# Patient Record
Sex: Male | Born: 1965 | Race: Black or African American | Hispanic: No | Marital: Single | Smoking: Current every day smoker
Health system: Southern US, Community
[De-identification: ages and names within clinical notes are randomized; demographics above are authoritative.]

## PROBLEM LIST (undated history)

## (undated) DIAGNOSIS — W3400XA Accidental discharge from unspecified firearms or gun, initial encounter: Secondary | ICD-10-CM

## (undated) DIAGNOSIS — E785 Hyperlipidemia, unspecified: Secondary | ICD-10-CM

## (undated) DIAGNOSIS — I10 Essential (primary) hypertension: Secondary | ICD-10-CM

## (undated) HISTORY — DX: Accidental discharge from unspecified firearms or gun, initial encounter: W34.00XA

---

## 2015-12-23 ENCOUNTER — Emergency Department (HOSPITAL_COMMUNITY): Payer: Self-pay

## 2015-12-23 ENCOUNTER — Emergency Department (HOSPITAL_COMMUNITY)
Admission: EM | Admit: 2015-12-23 | Discharge: 2015-12-24 | Disposition: A | Discharge status: Home / Self Care | Payer: Self-pay | Attending: Emergency Medicine | Admitting: Emergency Medicine

## 2015-12-23 ENCOUNTER — Encounter (HOSPITAL_COMMUNITY): Payer: Self-pay | Admitting: *Deleted

## 2015-12-23 DIAGNOSIS — R030 Elevated blood-pressure reading, without diagnosis of hypertension: Secondary | ICD-10-CM

## 2015-12-23 DIAGNOSIS — IMO0001 Reserved for inherently not codable concepts without codable children: Secondary | ICD-10-CM

## 2015-12-23 DIAGNOSIS — F17210 Nicotine dependence, cigarettes, uncomplicated: Secondary | ICD-10-CM | POA: Insufficient documentation

## 2015-12-23 DIAGNOSIS — I10 Essential (primary) hypertension: Secondary | ICD-10-CM | POA: Insufficient documentation

## 2015-12-23 DIAGNOSIS — H538 Other visual disturbances: Secondary | ICD-10-CM | POA: Insufficient documentation

## 2015-12-23 DIAGNOSIS — K0889 Other specified disorders of teeth and supporting structures: Secondary | ICD-10-CM | POA: Insufficient documentation

## 2015-12-23 HISTORY — DX: Essential (primary) hypertension: I10

## 2015-12-23 LAB — CBC WITH DIFFERENTIAL/PLATELET
BASOS ABS: 0 10*3/uL (ref 0.0–0.1)
Basophils Relative: 0 %
EOS PCT: 2 %
Eosinophils Absolute: 0.1 10*3/uL (ref 0.0–0.7)
HEMATOCRIT: 40.4 % (ref 39.0–52.0)
Hemoglobin: 13 g/dL (ref 13.0–17.0)
LYMPHS PCT: 47 %
Lymphs Abs: 2.3 10*3/uL (ref 0.7–4.0)
MCH: 29.5 pg (ref 26.0–34.0)
MCHC: 32.2 g/dL (ref 30.0–36.0)
MCV: 91.8 fL (ref 78.0–100.0)
MONO ABS: 0.4 10*3/uL (ref 0.1–1.0)
MONOS PCT: 8 %
NEUTROS ABS: 2.1 10*3/uL (ref 1.7–7.7)
Neutrophils Relative %: 43 %
PLATELETS: 173 10*3/uL (ref 150–400)
RBC: 4.4 MIL/uL (ref 4.22–5.81)
RDW: 13.2 % (ref 11.5–15.5)
WBC: 4.9 10*3/uL (ref 4.0–10.5)

## 2015-12-23 MED ORDER — HYDROCODONE-ACETAMINOPHEN 5-325 MG PO TABS
1.0000 | ORAL_TABLET | Freq: Once | ORAL | Status: AC
  Administered 2015-12-24: 1 via ORAL
  Filled 2015-12-23: qty 1

## 2015-12-23 MED ORDER — HYDROCODONE-ACETAMINOPHEN 5-325 MG PO TABS
1.0000 | ORAL_TABLET | Freq: Once | ORAL | Status: AC
  Administered 2015-12-23: 1 via ORAL
  Filled 2015-12-23: qty 1

## 2015-12-23 NOTE — ED Notes (Signed)
Pt arrives via EMS from home. Pt has not taken BP meds in 5 years. Pt called EMS because of high blood pressure and headache, also c/o mouth pain from broken tooth. Pt also states that his left arm has been going numb intermittently x1 month.

## 2015-12-24 ENCOUNTER — Ambulatory Visit: Payer: Self-pay | Attending: Family Medicine | Admitting: Family Medicine

## 2015-12-24 ENCOUNTER — Encounter: Payer: Self-pay | Admitting: Family Medicine

## 2015-12-24 VITALS — BP 148/97 | HR 66 | Temp 97.7°F | Resp 16 | Ht 79.0 in | Wt 232.2 lb

## 2015-12-24 DIAGNOSIS — S025XXA Fracture of tooth (traumatic), initial encounter for closed fracture: Secondary | ICD-10-CM | POA: Insufficient documentation

## 2015-12-24 DIAGNOSIS — K0889 Other specified disorders of teeth and supporting structures: Secondary | ICD-10-CM

## 2015-12-24 DIAGNOSIS — F17200 Nicotine dependence, unspecified, uncomplicated: Secondary | ICD-10-CM

## 2015-12-24 DIAGNOSIS — Z720 Tobacco use: Secondary | ICD-10-CM | POA: Insufficient documentation

## 2015-12-24 DIAGNOSIS — Z79899 Other long term (current) drug therapy: Secondary | ICD-10-CM | POA: Insufficient documentation

## 2015-12-24 DIAGNOSIS — I10 Essential (primary) hypertension: Secondary | ICD-10-CM

## 2015-12-24 LAB — URINE MICROSCOPIC-ADD ON: Squamous Epithelial / LPF: NONE SEEN

## 2015-12-24 LAB — BASIC METABOLIC PANEL
Anion gap: 10 (ref 5–15)
BUN: 7 mg/dL (ref 6–20)
CALCIUM: 8.7 mg/dL — AB (ref 8.9–10.3)
CHLORIDE: 104 mmol/L (ref 101–111)
CO2: 24 mmol/L (ref 22–32)
CREATININE: 0.96 mg/dL (ref 0.61–1.24)
GFR calc Af Amer: >60 mL/min (ref >60)
Glucose, Bld: 101 mg/dL — ABNORMAL HIGH (ref 65–99)
Potassium: 3.8 mmol/L (ref 3.5–5.1)
SODIUM: 138 mmol/L (ref 135–145)

## 2015-12-24 LAB — URINALYSIS, ROUTINE W REFLEX MICROSCOPIC
BILIRUBIN URINE: NEGATIVE
Glucose, UA: NEGATIVE mg/dL
Ketones, ur: NEGATIVE mg/dL
Leukocytes, UA: NEGATIVE
Nitrite: NEGATIVE
PH: 7 (ref 5.0–8.0)
Protein, ur: NEGATIVE mg/dL
SPECIFIC GRAVITY, URINE: 1.006 (ref 1.005–1.030)

## 2015-12-24 LAB — TROPONIN I

## 2015-12-24 MED ORDER — PENICILLIN V POTASSIUM 250 MG PO TABS
500.0000 mg | ORAL_TABLET | Freq: Once | ORAL | Status: AC
  Administered 2015-12-24: 500 mg via ORAL
  Filled 2015-12-24: qty 2

## 2015-12-24 MED ORDER — HYDROCHLOROTHIAZIDE 25 MG PO TABS: 25.0000 mg | ORAL_TABLET | Freq: Every day | ORAL | Status: DC

## 2015-12-24 MED ORDER — PENICILLIN V POTASSIUM 500 MG PO TABS: 500.0000 mg | ORAL_TABLET | Freq: Four times a day (QID) | ORAL | Status: AC

## 2015-12-24 MED ORDER — HYDROCHLOROTHIAZIDE 25 MG PO TABS
25.0000 mg | ORAL_TABLET | Freq: Every day | ORAL | Status: DC
  Administered 2015-12-24: 25 mg via ORAL
  Filled 2015-12-24: qty 1

## 2015-12-24 MED FILL — ?HYDROCHLOROTHIAZIDE 25 MG: 25 MG | 10 days supply | Qty: 10 | Fill #0

## 2015-12-24 MED FILL — PENICILLIN VK 500 MG TABLET: 500 | 10 days supply | Qty: 40 | Fill #0

## 2015-12-24 NOTE — Discharge Instructions (Signed)
You need to follow up with a primary physician in 2 days to check on your blood pressure and medication. Call the clinic listed and let them know you were seen in the ER and told to follow up in 2 days. Please take all of your antibiotics until finished! Ibuprofen as needed for pain.  Return to ER for any new or worsening symptoms, any additional concerns.   [employer] [facility] [employer] is a great source of information about community services available.  Access by dialing 2-1-1 from anywhere in [place], or by website -  PooledIncome.pl.   Other Local Resources (Updated 09/2015)  Dental  Care   Services    Phone Number and Address  Cost  [facility] For children 40 - 8 years of age:   Cleaning  Tooth brushing/flossing instruction  Sealants, fillings, crowns  Extractions  Emergency treatment  [phone] [address] [place], [place] [address] Charges based on family income.  Medicaid and some insurance plans accepted.     [facility] - [facility], fillings, crowns  Extractions  Emergency treatment [phone] [address] [place], [place]  Pregnant women 47 years of age or older with a Medicaid card  [facility] - [place]  Cleaning  Sealants, fillings, crowns  Extractions  Emergency treatment [phone] [address] [place], [place] Pregnant women 81 years of age or older with a Medicaid card  [facility] - [facility] For children 14 - 86 years of age:   Cleaning  Tooth brushing/flossing instruction  Sealants, fillings, crowns  Extractions  Emergency treatment Limited orthodontic services for patients with Medicaid [phone] [address] [place], [place] [address] Medicaid and [facility] Health Choice cover for children up to age 37 and pregnant women.   Parents of children up to age 7 without Medicaid pay a reduced fee at time of service.  [facility] of [employer] For children 21 - 44 years of age:   Cleaning  Tooth brushing/flossing instruction  Sealants, fillings, crowns  Extractions  Emergency treatment Limited orthodontic services for patients with Medicaid [phone] [address] [place], [place].  Medicaid and [place] Health Choice cover for children up to age 40 and pregnant women.  Parents of children up to age 74 without Medicaid pay a reduced fee.  [facility]  Sealants, fillings, crowns  Extractions  Hours: Tuesdays and Thursdays, 4:15 - 8 pm [phone] [address] [place], [place] [address] Services free of charge to [facility] residents ages 18-64 who do not have health insurance, Medicare, [place], or [place] benefits and fall within federal poverty guidelines  [employer]    Provides dental care in addition to primary medical care, nutritional counseling, and pharmacy:  Nurse, mental health, fillings, crowns  Extractions                  [phone] [facility], [address] [place], [place]  [phone] [facility], [address] [place] [place], [place]  [phone] [facility] [place], [place]  [phone] [facility], [address] [place], [place]  [phone] [facility] [address] [place], [place] Accepts [place], [place], most insurance.  Also provides services available to all with fees adjusted  based on ability to pay.    [facility]  Cleaning  Tooth brushing/flossing instruction  Sealants, fillings, crowns  Extractions  Emergency treatment Hours: Tuesdays, Thursdays, and Fridays from 8 am to 5 pm by  appointment only. [phone] [address] [place], [place] [id number] [facility] residents with Medicaid (depending on eligibility) and children with [facility] Health Choice - call for more information.  [facility]  Extractions only  Hours: 2nd and 4th Thursday of each month from 6:30 am - 9 am.   [phone]. [phone] [place], [place] [address] Ages 7518 and older only.  Patients are seen on a first come, first served basis.  [employer]  [employer]  Fillings  Extractions  Orthodontics  Endodontics  Implants/Crowns/Bridges  Complete and partial dentures [phone] [place], [place] Patients must complete an application for services.  There is often a waiting list.

## 2015-12-24 NOTE — Progress Notes (Signed)
   Subjective:  Patient ID: Phillip Oliver, male    DOB: 07/31/1966  Age: 50 y.o. MRN: [id number]  CC: Follow-up; Hypertension; Dental Pain; and Referral   HPI Phillip Riegerimothy Mota is a 50 year old male with a history of hypertension who was recently seen at the emergency room for toothache and found to have a blood pressure into the 200s systolic. He has not been under the care of a physician for over 30 years. He was placed on penicillin and hydrochlorothiazide which he is yet to pick up from the pharmacy.  Complains of toothache in the left lower premolar and a partially broken tooth. Denies gum swelling, fever or abscess formation.  Outpatient Prescriptions Prior to Visit  Medication Sig Dispense Refill  . acetaminophen (TYLENOL) 500 MG tablet Take 1,000 mg by mouth every 6 (six) hours as needed for moderate pain.    . hydrochlorothiazide (HYDRODIURIL) 25 MG tablet Take 1 tablet (25 mg total) by mouth daily. 10 tablet 0  . penicillin v potassium (VEETID) 500 MG tablet Take 1 tablet (500 mg total) by mouth 4 (four) times daily. 40 tablet 0   No facility-administered medications prior to visit.    ROS Review of Systems  Constitutional: Negative for activity change and appetite change.  HENT: Positive for dental problem. Negative for sinus pressure and sore throat.   Respiratory: Negative for chest tightness, shortness of breath and wheezing.   Cardiovascular: Negative for chest pain and palpitations.  Gastrointestinal: Negative for abdominal pain, constipation and abdominal distention.  Genitourinary: Negative.   Musculoskeletal: Negative.   Psychiatric/Behavioral: Negative for behavioral problems and dysphoric mood.    Objective:  BP 148/97 mmHg  Pulse 66  Temp(Src) 97.7 F (36.5 C) (Oral)  Resp 16  Ht 6\' 7"  (2.007 m)  Wt 232 lb 3.2 oz (105.325 kg)  BMI 26.15 kg/m2  SpO2 99%  BP/Weight 12/24/2015 12/24/2015  Systolic BP 148 191  Diastolic BP 97 126  Wt. (Lbs) 232.2 -  BMI  26.15 -      Physical Exam  Constitutional: He is oriented to person, place, and time. He appears well-developed and well-nourished.  HENT:  Multiple broken teeth with dental caries  Cardiovascular: Normal rate, normal heart sounds and intact distal pulses.   No murmur heard. Pulmonary/Chest: Effort normal and breath sounds normal. He has no wheezes. He has no rales. He exhibits no tenderness.  Abdominal: Soft. Bowel sounds are normal. He exhibits no distension and no mass. There is no tenderness.  Musculoskeletal: Normal range of motion.  Neurological: He is alert and oriented to person, place, and time.     Assessment & Plan:   1. Essential hypertension Uncontrolled. He is yet to pick up his hydrochlorothiazide from the pharmacy. Low-sodium, DASH diet. We'll reassess blood pressure at next office visit.  2. Tooth ache Yet to pick up penicillin prescription. Will need referral to dentist due to multiple broken teeth. Encouraged to apply. Orange card to facilitate this process  3. Tobacco use disorder Smoking cessation support: smoking cessation hotline: 1-800-QUIT-NOW.  Smoking cessation classes are available through [facility]. Call [phone] or visit our website at [email or web address].  Spent 3 minutes counseling on smoking cessation and patient is not ready to quit.   No orders of the defined types were placed in this encounter.    Follow-up: Return in about 2 weeks (around 01/07/2016) for Follow-up on hypertension.   Jaclyn ShaggyEnobong Amao MD

## 2015-12-24 NOTE — Progress Notes (Signed)
Patient's here for ED f/up for HTN and Dentalgia. Pt would like to est. care with PCP.  Patient requesting dental referral  Patient hasn't taken meds this morning

## 2015-12-24 NOTE — Patient Instructions (Signed)
Hypertension Hypertension, commonly called high blood pressure, is when the force of blood pumping through your arteries is too strong. Your arteries are the blood vessels that carry blood from your heart throughout your body. A blood pressure reading consists of a higher number over a lower number, such as 110/72. The higher number (systolic) is the pressure inside your arteries when your heart pumps. The lower number (diastolic) is the pressure inside your arteries when your heart relaxes. Ideally you want your blood pressure below 120/80. Hypertension forces your heart to work harder to pump blood. Your arteries may become narrow or stiff. Having untreated or uncontrolled hypertension can cause heart attack, stroke, kidney disease, and other problems. RISK FACTORS Some risk factors for high blood pressure are controllable. Others are not.  Risk factors you cannot control include:   Race. You may be at higher risk if you are African American.  Age. Risk increases with age.  Gender. Men are at higher risk than women before age 45 years. After age 65, women are at higher risk than men. Risk factors you can control include:  Not getting enough exercise or physical activity.  Being overweight.  Getting too much fat, sugar, calories, or salt in your diet.  Drinking too much alcohol. SIGNS AND SYMPTOMS Hypertension does not usually cause signs or symptoms. Extremely high blood pressure (hypertensive crisis) may cause headache, anxiety, shortness of breath, and nosebleed. DIAGNOSIS To check if you have hypertension, your health care provider will measure your blood pressure while you are seated, with your arm held at the level of your heart. It should be measured at least twice using the same arm. Certain conditions can cause a difference in blood pressure between your right and left arms. A blood pressure reading that is higher than normal on one occasion does not mean that you need treatment. If  it is not clear whether you have high blood pressure, you may be asked to return on a different day to have your blood pressure checked again. Or, you may be asked to monitor your blood pressure at home for 1 or more weeks. TREATMENT Treating high blood pressure includes making lifestyle changes and possibly taking medicine. Living a healthy lifestyle can help lower high blood pressure. You may need to change some of your habits. Lifestyle changes may include:  Following the DASH diet. This diet is high in fruits, vegetables, and whole grains. It is low in salt, red meat, and added sugars.  Keep your sodium intake below 2,300 mg per day.  Getting at least 30-45 minutes of aerobic exercise at least 4 times per week.  Losing weight if necessary.  Not smoking.  Limiting alcoholic beverages.  Learning ways to reduce stress. Your health care provider may prescribe medicine if lifestyle changes are not enough to get your blood pressure under control, and if one of the following is true:  You are 18-59 years of age and your systolic blood pressure is above 140.  You are 60 years of age or older, and your systolic blood pressure is above 150.  Your diastolic blood pressure is above 90.  You have diabetes, and your systolic blood pressure is over 140 or your diastolic blood pressure is over 90.  You have kidney disease and your blood pressure is above 140/90.  You have heart disease and your blood pressure is above 140/90. Your personal target blood pressure may vary depending on your medical conditions, your age, and other factors. HOME CARE INSTRUCTIONS    Have your blood pressure rechecked as directed by your health care provider.   Take medicines only as directed by your health care provider. Follow the directions carefully. Blood pressure medicines must be taken as prescribed. The medicine does not work as well when you skip doses. Skipping doses also puts you at risk for  problems.  Do not smoke.   Monitor your blood pressure at home as directed by your health care provider. SEEK MEDICAL CARE IF:   You think you are having a reaction to medicines taken.  You have recurrent headaches or feel dizzy.  You have swelling in your ankles.  You have trouble with your vision. SEEK IMMEDIATE MEDICAL CARE IF:  You develop a severe headache or confusion.  You have unusual weakness, numbness, or feel faint.  You have severe chest or abdominal pain.  You vomit repeatedly.  You have trouble breathing. MAKE SURE YOU:   Understand these instructions.  Will watch your condition.  Will get help right away if you are not doing well or get worse.   This information is not intended to replace advice given to you by your health care provider. Make sure you discuss any questions you have with your health care provider.   Document Released: 08/25/2005 Document Revised: 01/09/2015 Document Reviewed: 06/17/2013 Elsevier Interactive Patient Education 2016 Elsevier Inc.  

## 2015-12-24 NOTE — ED Provider Notes (Signed)
CSN: [id number]     Arrival date & time 12/23/15  2206 History   First MD Initiated Contact with Patient 12/23/15 2232     Chief Complaint  Patient presents with  . Hypertension  . Dental Pain    (Consider location/radiation/quality/duration/timing/severity/associated sxs/prior Treatment) Patient is a 50 y.o. male presenting with hypertension and tooth pain. The history is provided by the patient and medical records. No language interpreter was used.  Hypertension Associated symptoms include headaches. Pertinent negatives include no abdominal pain, chills, coughing, fever, nausea, neck pain, rash, vomiting or weakness.  Dental Pain Associated symptoms: headaches   Associated symptoms: no fever and no neck pain    Lolita Riegerimothy Tamburri is a 50 y.o. male  with a PMH of HTN presents to the Emergency Department with two complaints:  1. Dental pain - Patient states his right back molar had filling which broke while he was eating 3-4 days ago. Patient admits to gradually worsening, throbbing tooth pain since dental injury. Associated symptoms include left-sided headache. Denies shortness of breath, difficulty swallowing, fever, neck pain. Has not seen dentist. OTC pain relievers taken PTA with little relief.   2. Elevated blood pressure - BP at triage of 202/108. Patient states he has a history of hypertension and was on HCTZ. He has not been on any BP medications in the last 5 years due to financial reasons. No PCP in the area and recently moved here two months ago. Again patient with headache. Admits to bilateral blurry vision x 4-5 months. Denies chest pain, back pain, sob, abdominal pain. Per significant other at bedside, patient with elevated BP ranging 170's-200's systolic over the last two years. + smoker. No other known medical problems.   Past Medical History  Diagnosis Date  . Hypertension    History reviewed. No pertinent past surgical history. No family history on file. Social History    Substance Use Topics  . Smoking status: Current Every Day Smoker -- 0.50 packs/day    Types: Cigarettes  . Smokeless tobacco: None  . Alcohol Use: Yes     Comment: "As much as I can get"     Review of Systems  Constitutional: Negative for fever and chills.  HENT: Positive for dental problem.   Eyes: Positive for visual disturbance (Blurred vision x 4-5 months).  Respiratory: Negative for cough and shortness of breath.   Cardiovascular: Negative.   Gastrointestinal: Negative for nausea, vomiting and abdominal pain.  Genitourinary: Negative for dysuria.  Musculoskeletal: Negative for back pain and neck pain.  Skin: Negative for rash.  Neurological: Positive for headaches. Negative for dizziness and weakness.      Allergies  Review of patient's allergies indicates not on file.  Home Medications   Prior to Admission medications   Medication Sig Start Date End Date Taking? Authorizing Provider  acetaminophen (TYLENOL) 500 MG tablet Take 1,000 mg by mouth every 6 (six) hours as needed for moderate pain.   Yes Historical Provider, MD  hydrochlorothiazide (HYDRODIURIL) 25 MG tablet Take 1 tablet (25 mg total) by mouth daily. 12/24/15   Chase PicketJaime Pilcher Ward, PA-C  penicillin v potassium (VEETID) 500 MG tablet Take 1 tablet (500 mg total) by mouth 4 (four) times daily. 12/24/15 12/31/15  Jaime Pilcher Ward, PA-C   BP 191/126 mmHg  Pulse 56  Temp(Src) 98 F (36.7 C) (Oral)  Resp 19  SpO2 99% Physical Exam  Constitutional: He is oriented to person, place, and time. He appears well-developed and well-nourished.  Alert and in  no acute distress  HENT:  Head: Normocephalic and atraumatic.  Mouth/Throat:    Midline uvula, no trismus, oropharynx moist and clear, no abscess noted, no oropharyngeal erythema or edema, neck supple and no tenderness. No facial edema  Eyes: Conjunctivae and EOM are normal. Pupils are equal, round, and reactive to light.  Cardiovascular: Normal rate, regular  rhythm, normal heart sounds and intact distal pulses.  Exam reveals no gallop and no friction rub.   No murmur heard. Pulmonary/Chest: Effort normal and breath sounds normal. No respiratory distress. He has no wheezes. He has no rales. He exhibits no tenderness.  Abdominal: Soft. Bowel sounds are normal. He exhibits no distension and no mass. There is no tenderness. There is no rebound and no guarding.  Musculoskeletal: He exhibits no edema.  Neurological: He is alert and oriented to person, place, and time. No cranial nerve deficit.  Skin: Skin is warm and dry.  Nursing note and vitals reviewed.   ED Course  Procedures (including critical care time) Labs Review Labs Reviewed  BASIC METABOLIC PANEL - Abnormal; Notable for the following:    Glucose, Bld 101 (*)    Calcium 8.7 (*)    All other components within normal limits  URINALYSIS, ROUTINE W REFLEX MICROSCOPIC (NOT AT [facility]) - Abnormal; Notable for the following:    Hgb urine dipstick TRACE (*)    All other components within normal limits  URINE MICROSCOPIC-ADD ON - Abnormal; Notable for the following:    Bacteria, UA RARE (*)    All other components within normal limits  CBC WITH DIFFERENTIAL/PLATELET  TROPONIN I    Imaging Review Dg Chest 2 View  12/24/2015  CLINICAL DATA:  Initial evaluation for hypertension. EXAM: CHEST  2 VIEW COMPARISON:  None. FINDINGS: The heart size and mediastinal contours are within normal limits. Both lungs are clear. The visualized skeletal structures are unremarkable. IMPRESSION: No active cardiopulmonary disease. Electronically Signed   By: Rise Mu M.D.   On: 12/24/2015 00:19   I have personally reviewed and evaluated these images and lab results as part of my medical decision-making.   EKG Interpretation   Date/Time:  Sunday December 23 2015 22:15:54 EDT Ventricular Rate:  53 PR Interval:  146 QRS Duration: 91 QT Interval:  465 QTC Calculation: 437 R Axis:   52 Text  Interpretation:  Sinus rhythm Probable left ventricular hypertrophy  ST elev, probable normal early repol pattern agree with above No previous  ECGs available Confirmed by LITTLE MD, RACHEL [phone]) on 12/23/2015  11:07:12 PM      MDM   Final diagnoses:  Elevated blood pressure  Dentalgia   Giann Obara presents to ED for two complaints:  1. Dental pain. No abscess requiring immediate incision and drainage. Patient is afebrile, non toxic appearing, and swallowing secretions well. Exam not concerning for Ludwig's angina or pharyngeal abscess. Will treat with PenVK - first dose given here. I gave patient dental resources and stressed the importance of dental follow up for ultimate management of dental pain. Patient voices understanding and is agreeable to plan.  2. Elevated BP: Triage BP was 202/108. Treated dental pain to see if BP would lower when pain is controlled - BP improved to 195/110. Patient has been off BP meds for the last 5 years, stating BP was systolic of 170-200 over the last two years. Associated symptoms include HA but unsure if etiology is elevated BP or dental pain. CBC, BMP, Troponin, UA, CXR reviewed and reassuring. Patient was on  HCTZ previously, will restart this home medication. Patient informed that he must follow up with PCP in 2 days for BMP and BP recheck. [facility] given, pcp resource guide given. Patient expressed understanding and agreement with plan as dictated above. Return precautions were given and all questions answered.   Patient discussed with Dr. Clarene Duke who agrees with treatment plan.   [facility] Ward, PA-C 12/24/15 0146  Laurence Spates, MD 12/24/15 [phone]

## 2015-12-28 ENCOUNTER — Encounter: Payer: Self-pay | Admitting: Clinical

## 2015-12-28 NOTE — Progress Notes (Signed)
Depression screen [facility] 12/24/2015  Decreased Interest 0  Down, Depressed, Hopeless 3  PHQ - 2 Score 3  Altered sleeping 3  Tired, decreased energy 3  Change in appetite 1  Feeling bad or failure about yourself  0  Trouble concentrating 0  Moving slowly or fidgety/restless 0  Suicidal thoughts 0  PHQ-9 Score 10    GAD 7 : Generalized Anxiety Score 12/24/2015  Nervous, Anxious, on Edge 0  Control/stop worrying 0  Worry too much - different things 0  Trouble relaxing 0  Restless 0  Easily annoyed or irritable 1  Afraid - awful might happen 0  Total GAD 7 Score 1

## 2016-01-07 ENCOUNTER — Other Ambulatory Visit: Payer: Self-pay

## 2016-01-07 ENCOUNTER — Emergency Department (HOSPITAL_COMMUNITY): Payer: Self-pay

## 2016-01-07 ENCOUNTER — Encounter (HOSPITAL_COMMUNITY): Payer: Self-pay | Admitting: *Deleted

## 2016-01-07 ENCOUNTER — Emergency Department (HOSPITAL_COMMUNITY)
Admission: EM | Admit: 2016-01-07 | Discharge: 2016-01-07 | Disposition: A | Discharge status: Home / Self Care | Payer: Self-pay | Attending: Emergency Medicine | Admitting: Emergency Medicine

## 2016-01-07 ENCOUNTER — Encounter: Payer: Self-pay | Admitting: Family Medicine

## 2016-01-07 ENCOUNTER — Ambulatory Visit (HOSPITAL_COMMUNITY)
Admit: 2016-01-07 | Discharge: 2016-01-07 | Disposition: A | Discharge status: Home / Self Care | Payer: Self-pay | Attending: Family Medicine | Admitting: Family Medicine

## 2016-01-07 ENCOUNTER — Ambulatory Visit: Payer: Self-pay | Attending: Family Medicine | Admitting: Family Medicine

## 2016-01-07 VITALS — BP 175/117 | HR 65 | Temp 98.2°F | Resp 20 | Ht 79.0 in | Wt 240.0 lb

## 2016-01-07 DIAGNOSIS — I10 Essential (primary) hypertension: Secondary | ICD-10-CM | POA: Insufficient documentation

## 2016-01-07 DIAGNOSIS — I158 Other secondary hypertension: Secondary | ICD-10-CM | POA: Insufficient documentation

## 2016-01-07 DIAGNOSIS — Z79899 Other long term (current) drug therapy: Secondary | ICD-10-CM | POA: Insufficient documentation

## 2016-01-07 DIAGNOSIS — R030 Elevated blood-pressure reading, without diagnosis of hypertension: Secondary | ICD-10-CM

## 2016-01-07 DIAGNOSIS — R208 Other disturbances of skin sensation: Secondary | ICD-10-CM

## 2016-01-07 DIAGNOSIS — R200 Anesthesia of skin: Secondary | ICD-10-CM | POA: Insufficient documentation

## 2016-01-07 DIAGNOSIS — IMO0001 Reserved for inherently not codable concepts without codable children: Secondary | ICD-10-CM

## 2016-01-07 DIAGNOSIS — Z9119 Patient's noncompliance with other medical treatment and regimen: Secondary | ICD-10-CM | POA: Insufficient documentation

## 2016-01-07 DIAGNOSIS — F17210 Nicotine dependence, cigarettes, uncomplicated: Secondary | ICD-10-CM | POA: Insufficient documentation

## 2016-01-07 DIAGNOSIS — R9431 Abnormal electrocardiogram [ECG] [EKG]: Secondary | ICD-10-CM

## 2016-01-07 DIAGNOSIS — M2550 Pain in unspecified joint: Secondary | ICD-10-CM | POA: Insufficient documentation

## 2016-01-07 LAB — BASIC METABOLIC PANEL
ANION GAP: 9 (ref 5–15)
BUN: 10 mg/dL (ref 6–20)
CALCIUM: 8.9 mg/dL (ref 8.9–10.3)
CO2: 25 mmol/L (ref 22–32)
Chloride: 108 mmol/L (ref 101–111)
Creatinine, Ser: 1.09 mg/dL (ref 0.61–1.24)
Glucose, Bld: 92 mg/dL (ref 65–99)
POTASSIUM: 4.2 mmol/L (ref 3.5–5.1)
SODIUM: 142 mmol/L (ref 135–145)

## 2016-01-07 LAB — CBC
HEMATOCRIT: 40.8 % (ref 39.0–52.0)
HEMOGLOBIN: 13.6 g/dL (ref 13.0–17.0)
MCH: 30.6 pg (ref 26.0–34.0)
MCHC: 33.3 g/dL (ref 30.0–36.0)
MCV: 91.7 fL (ref 78.0–100.0)
Platelets: 220 10*3/uL (ref 150–400)
RBC: 4.45 MIL/uL (ref 4.22–5.81)
RDW: 13.2 % (ref 11.5–15.5)
WBC: 4.4 10*3/uL (ref 4.0–10.5)

## 2016-01-07 LAB — I-STAT TROPONIN, ED
Troponin i, poc: 0 ng/mL (ref 0.00–0.08)
Troponin i, poc: 0 ng/mL (ref 0.00–0.08)

## 2016-01-07 MED ORDER — ASPIRIN 325 MG PO TABS: 325.0000 mg | ORAL_TABLET | Freq: Every day | ORAL | Status: DC

## 2016-01-07 MED ORDER — HYDROCHLOROTHIAZIDE 25 MG PO TABS: 25.0000 mg | ORAL_TABLET | Freq: Every day | ORAL | Status: DC

## 2016-01-07 MED ORDER — NITROGLYCERIN 0.4 MG SL SUBL
0.4000 mg | SUBLINGUAL_TABLET | SUBLINGUAL | Status: DC | PRN
Start: 2016-01-07 — End: 2016-01-07
  Administered 2016-01-07 (×2): 0.4 mg via SUBLINGUAL
  Filled 2016-01-07: qty 1

## 2016-01-07 MED ORDER — ASPIRIN 325 MG PO TABS: 325.0000 mg | ORAL_TABLET | Freq: Once | ORAL | Status: DC

## 2016-01-07 NOTE — Progress Notes (Signed)
Patient's here for f/up HTN  Patient reports not taking BP meds because he ran out yesterday.  Patient states he's having numbness in his L shoulder and hand.  Requesting med refill.  Per Dr. Venetia NightAmao, patient will not be given any Clonidine this OV.

## 2016-01-07 NOTE — ED Notes (Signed)
Pa  at bedside. 

## 2016-01-07 NOTE — ED Provider Notes (Signed)
CSN: [id number]     Arrival date & time 01/07/16  1504 History   First MD Initiated Contact with Patient 01/07/16 1733     Chief Complaint  Patient presents with  . Abnormal ECG  . Hypertension    HPI   Phillip Oliver is a 50 y.o. male with a PMH of HTN who presents to the ED [facility] due to EKG changes and HTN. He reports he woke up this morning with shoulder pain and numbness, though notes this is now resolved. He states he was seen at [facility] for refill of his blood pressure medication (has not taken his HCTZ in 1 week), and was noted to have EKG changes, so was sent to the ED for further evaluation. He was given ASA at that time. He reports mild chest pressure since taking ASA, though thinks this is because the medication is "stuck." He denies exacerbating or alleviating factors. He denies fever, chills, cough, congestion, shortness of breath, abdominal pain, N/V, weakness, dizziness, lightheadedness, history of similar symptoms, family history of heart disease.   Past Medical History  Diagnosis Date  . Hypertension    History reviewed. No pertinent past surgical history. History reviewed. No pertinent family history. Social History  Substance Use Topics  . Smoking status: Current Every Day Smoker -- 0.50 packs/day    Types: Cigarettes  . Smokeless tobacco: None  . Alcohol Use: Yes     Comment: "As much as I can get"       Review of Systems  Constitutional: Negative for fever and chills.  HENT: Negative for congestion.   Respiratory: Negative for cough and shortness of breath.   Cardiovascular: Positive for chest pain.  Gastrointestinal: Negative for nausea, vomiting and abdominal pain.  Musculoskeletal: Positive for arthralgias.  Neurological: Positive for numbness. Negative for dizziness, syncope, weakness and light-headedness.  All other systems reviewed and are negative.     Allergies  Tylenol  Home Medications   Prior to  Admission medications   Not on File     BP 146/107 mmHg  Pulse 56  Temp(Src) 98.5 F (36.9 C) (Oral)  Resp 16  SpO2 100% Physical Exam  Constitutional: He is oriented to person, place, and time. He appears well-developed and well-nourished. No distress.  HENT:  Head: Normocephalic and atraumatic.  Right Ear: External ear normal.  Left Ear: External ear normal.  Nose: Nose normal.  Mouth/Throat: Uvula is midline, oropharynx is clear and moist and mucous membranes are normal.  Eyes: Conjunctivae, EOM and lids are normal. Pupils are equal, round, and reactive to light. Right eye exhibits no discharge. Left eye exhibits no discharge. No scleral icterus.  Neck: Normal range of motion. Neck supple.  Cardiovascular: Normal rate, regular rhythm, normal heart sounds, intact distal pulses and normal pulses.   Pulmonary/Chest: Effort normal and breath sounds normal. No respiratory distress. He has no wheezes. He has no rales.  Abdominal: Soft. Normal appearance and bowel sounds are normal. He exhibits no distension and no mass. There is no tenderness. There is no rigidity, no rebound and no guarding.  Musculoskeletal: Normal range of motion. He exhibits no edema or tenderness.  Neurological: He is alert and oriented to person, place, and time. He has normal strength. No sensory deficit.  Skin: Skin is warm, dry and intact. No rash noted. He is not diaphoretic. No erythema. No pallor.  Psychiatric: He has a normal mood and affect. His speech is normal and behavior is  normal.  Nursing note and vitals reviewed.   ED Course  Procedures (including critical care time)  Labs Review Labs Reviewed  BASIC METABOLIC PANEL  CBC  I-STAT TROPOININ, ED  Rosezena Sensor, ED    Imaging Review Dg Chest 2 View  01/07/2016  CLINICAL DATA:  Central chest pain for 1 hour EXAM: CHEST  2 VIEW COMPARISON:  12/23/2015 FINDINGS: Mild left-sided cardiac enlargement stable. Vascular pattern normal. Uncoiling of  the aorta stable. Lungs clear. IMPRESSION: No active cardiopulmonary disease. Electronically Signed   By: Esperanza Heir M.D.   On: 01/07/2016 16:09   I have personally reviewed and evaluated these images and lab results as part of my medical decision-making.   EKG Interpretation   Date/Time:  Monday Jan 07 2016 18:01:06 EDT Ventricular Rate:  60 PR Interval:  141 QRS Duration: 104 QT Interval:  453 QTC Calculation: 453 R Axis:   51 Text Interpretation:  Sinus rhythm ST elev, probable normal early repol  pattern Confirmed by YELVERTON  MD, DAVID ([id number]) on 01/07/2016 6:05:44 PM      MDM   Final diagnoses:  Elevated blood pressure  Left arm numbness    50 year old male presents from urgent care due to elevated BP and EKG changes noted during his evaluation. He reports left sided arm numbness, now resolved, and mild chest pain. Denies fever, chills, cough, congestion, shortness of breath, abdominal pain, N/V, weakness, dizziness, lightheadedness, history of similar symptoms, family history of heart disease.  Patient is afebrile. BP 140-180 systolic, 110s diastolic. Heart regular rate and rhythm. Lungs clear to auscultation bilaterally. Abdomen soft, non-tender, non-distended. Strength and sensation intact. No lower extremity edema.  EKG sinus rhythm, ST elevation, probably normal early repolarization. Troponin negative. CBC negative for leukocytosis or anemia, CMP unremarkable. CXR no active cardiopulmonary disease.  HEART score 3. Will give nitro and recheck troponin and EKG.  Repeat troponin negative. Repeat EKG with no significant change. On reassessment of patient, he reports improvement in symptoms.  Patient is non-toxic and well-appearing, feel he is stable for discharge at this time. Patient discussed with and seen by Dr. Ranae Palms. Low suspicion for ACS given unremarkable work-up in the ED. Patient to follow-up with PCP and with cardiology. Spoke with patient at length  regarding importance of taking his prescribed anti-hypertensive. Strict return precautions discussed. Patient verbalizes his understanding and is in agreement with plan.  BP 146/107 mmHg  Pulse 56  Temp(Src) 98.5 F (36.9 C) (Oral)  Resp 16  SpO2 100%     Mady Gemma, PA-C 01/07/16 2010  Loren Racer, MD 01/08/16 [id number]

## 2016-01-07 NOTE — ED Notes (Signed)
Pt ambulates independently and with steady gait at time of discharge. Discharge instructions and follow up information reviewed with patient. No other questions or concerns voiced at this time.  

## 2016-01-07 NOTE — Progress Notes (Signed)
   Subjective:  Patient ID: Phillip Oliver, male    DOB: 10/01/1965  Age: 50 y.o. MRN: [id number]  CC: Medication Refill   HPI Phillip Oliver is a 50 year old male who comes into the clinic for follow-up of hypertension and reports running out of his blood pressure pill yesterday. As the patient he received 10 pills from his original prescription which she received from the ED. Complains of left arm numbness which started this morning and his blood pressure is 175/117; he denies shortness of breath, chest pain, diaphoresis, nausea and has no blurry vision.  He is also concerned about "his nature" and the "pills messing with his nature" and so he has skipped the pills on a few occasions.  Outpatient Prescriptions Prior to Visit  Medication Sig Dispense Refill  . acetaminophen (TYLENOL) 500 MG tablet Take 1,000 mg by mouth every 6 (six) hours as needed for moderate pain.    . hydrochlorothiazide (HYDRODIURIL) 25 MG tablet Take 1 tablet (25 mg total) by mouth daily. 10 tablet 0   No facility-administered medications prior to visit.    ROS Review of Systems  Constitutional: Negative for activity change and appetite change.  HENT: Negative for sinus pressure and sore throat.   Respiratory: Negative for chest tightness, shortness of breath and wheezing.   Cardiovascular: Negative for chest pain and palpitations.  Gastrointestinal: Negative for abdominal pain, constipation and abdominal distention.  Genitourinary: Negative.   Neurological: Positive for numbness.  Psychiatric/Behavioral: Negative for behavioral problems and dysphoric mood.    Objective:  BP 175/117 mmHg  Pulse 65  Temp(Src) 98.2 F (36.8 C) (Oral)  Resp 20  Ht 6\' 7"  (2.007 m)  Wt 240 lb (108.863 kg)  BMI 27.03 kg/m2  SpO2 99%  BP/Weight 01/07/2016 12/24/2015 12/24/2015  Systolic BP 175 148 191  Diastolic BP 117 97 126  Wt. (Lbs) 240 232.2 -  BMI 27.03 26.15 -      Physical Exam  Constitutional: He is oriented  to person, place, and time. He appears well-developed and well-nourished.  Cardiovascular: Normal rate, normal heart sounds and intact distal pulses.   No murmur heard. Pulmonary/Chest: Effort normal and breath sounds normal. He has no wheezes. He has no rales. He exhibits no tenderness.  Abdominal: Soft. Bowel sounds are normal. He exhibits no distension and no mass. There is no tenderness.  Musculoskeletal: Normal range of motion.  Neurological: He is alert and oriented to person, place, and time.     Assessment & Plan:   1. Accelerated hypertension Due to noncompliance and running out of antihypertensive In the setting of bradycardia, no clonidine was administered in the clinic Will switch from Lisinopril to Lisinopril/HCTZ  2. Left arm numbness Would love to exclude  Acute coronary syndrome  and possible end organ affectation given accelerated blood pressure,  acute onset of left arm numbness and ST elevation in EKG from today compared to EKG from 2 weeks ago ASA 81 mg given and referred to the ED.  3. Nonspecific abnormal electrocardiogram (ECG) (EKG) ST elevation in V1 absent on previous EKG.  Advised we would have discussions about his ED once his BP is better controlled   No orders of the defined types were placed in this encounter.    Follow-up: Return in about 1 week (around 01/14/2016) for follow up on Hypertension.   Jaclyn ShaggyEnobong Amao MD

## 2016-01-07 NOTE — Discharge Instructions (Signed)
1. Medications: usual home medications 2. Treatment: rest, drink plenty of fluids 3. Follow Up: please followup with your primary doctor and with cardiology for discussion of your diagnoses and further evaluation after today's visit; please return to the ER for severe chest pain, shortness of breath, new or worsening symptoms   Hypertension Hypertension is another name for high blood pressure. High blood pressure forces your heart to work harder to pump blood. A blood pressure reading has two numbers, which includes a higher number over a lower number (example: 110/72). HOME CARE   Have your blood pressure rechecked by your doctor.  Only take medicine as told by your doctor. Follow the directions carefully. The medicine does not work as well if you skip doses. Skipping doses also puts you at risk for problems.  Do not smoke.  Monitor your blood pressure at home as told by your doctor. GET HELP IF:  You think you are having a reaction to the medicine you are taking.  You have repeat headaches or feel dizzy.  You have puffiness (swelling) in your ankles.  You have trouble with your vision. GET HELP RIGHT AWAY IF:   You get a very bad headache and are confused.  You feel weak, numb, or faint.  You get chest or belly (abdominal) pain.  You throw up (vomit).  You cannot breathe very well. MAKE SURE YOU:   Understand these instructions.  Will watch your condition.  Will get help right away if you are not doing well or get worse.   This information is not intended to replace advice given to you by your health care provider. Make sure you discuss any questions you have with your health care provider.   Document Released: 02/11/2008 Document Revised: 08/30/2013 Document Reviewed: 06/17/2013 Elsevier Interactive Patient Education [employer]2016 Elsevier Inc.

## 2016-01-07 NOTE — ED Notes (Signed)
Pt reports being seen at [facility] today for HTN and mild pain to left shoulder area. Pt was sent here for abnormal ekg. Pt appears in no acute distress at triage.

## 2016-01-07 NOTE — ED Notes (Signed)
MD at bedside. [email or web address].

## 2016-01-08 MED FILL — ?HYDROCHLOROTHIAZIDE 25 MG: 25 MG | 10 days supply | Qty: 10 | Fill #0

## 2016-01-15 ENCOUNTER — Ambulatory Visit: Payer: Self-pay | Attending: Family Medicine

## 2016-01-18 ENCOUNTER — Ambulatory Visit: Payer: Self-pay | Admitting: Family Medicine

## 2016-02-22 ENCOUNTER — Ambulatory Visit: Payer: Self-pay | Attending: Family Medicine | Admitting: Family Medicine

## 2016-02-22 ENCOUNTER — Encounter: Payer: Self-pay | Admitting: Family Medicine

## 2016-02-22 VITALS — BP 160/117 | HR 84 | Temp 98.0°F | Resp 14 | Ht 79.0 in | Wt 242.2 lb

## 2016-02-22 DIAGNOSIS — M170 Bilateral primary osteoarthritis of knee: Secondary | ICD-10-CM

## 2016-02-22 DIAGNOSIS — K0889 Other specified disorders of teeth and supporting structures: Secondary | ICD-10-CM

## 2016-02-22 DIAGNOSIS — M1990 Unspecified osteoarthritis, unspecified site: Secondary | ICD-10-CM | POA: Insufficient documentation

## 2016-02-22 DIAGNOSIS — N529 Male erectile dysfunction, unspecified: Secondary | ICD-10-CM

## 2016-02-22 DIAGNOSIS — I10 Essential (primary) hypertension: Secondary | ICD-10-CM

## 2016-02-22 LAB — LIPID PANEL
Cholesterol: 218 mg/dL — ABNORMAL HIGH (ref 125–200)
HDL: 63 mg/dL (ref >=40)
LDL Cholesterol: 130 mg/dL — ABNORMAL HIGH (ref <130)
Total CHOL/HDL Ratio: 3.5 Ratio (ref <=5.0)
Triglycerides: 126 mg/dL (ref <150)
VLDL: 25 mg/dL (ref <30)

## 2016-02-22 MED ORDER — AMOXICILLIN 500 MG PO CAPS: 500.0000 mg | ORAL_CAPSULE | Freq: Three times a day (TID) | ORAL | Status: DC

## 2016-02-22 MED ORDER — NAPROXEN 500 MG PO TABS: 500.0000 mg | ORAL_TABLET | Freq: Two times a day (BID) | ORAL | Status: DC

## 2016-02-22 MED ORDER — SILDENAFIL CITRATE 100 MG PO TABS: 50.0000 mg | ORAL_TABLET | Freq: Every day | ORAL | Status: DC | PRN

## 2016-02-22 MED ORDER — LISINOPRIL-HYDROCHLOROTHIAZIDE 20-25 MG PO TABS: 1.0000 | ORAL_TABLET | Freq: Every day | ORAL | Status: DC

## 2016-02-22 MED FILL — AMOXICILLIN 500 MG CAPSULE: 500 | 10 days supply | Qty: 30 | Fill #0

## 2016-02-22 MED FILL — LISINOPRIL-HCTZ 20-25 MG TA: 20-25 | 30 days supply | Qty: 30 | Fill #0

## 2016-02-22 MED FILL — NAPROXEN 500 MG TABLET: 500 | 30 days supply | Qty: 30 | Fill #0

## 2016-02-22 NOTE — Patient Instructions (Signed)
Hypertension Hypertension, commonly called high blood pressure, is when the force of blood pumping through your arteries is too strong. Your arteries are the blood vessels that carry blood from your heart throughout your body. A blood pressure reading consists of a higher number over a lower number, such as 110/72. The higher number (systolic) is the pressure inside your arteries when your heart pumps. The lower number (diastolic) is the pressure inside your arteries when your heart relaxes. Ideally you want your blood pressure below 120/80. Hypertension forces your heart to work harder to pump blood. Your arteries may become narrow or stiff. Having untreated or uncontrolled hypertension can cause heart attack, stroke, kidney disease, and other problems. RISK FACTORS Some risk factors for high blood pressure are controllable. Others are not.  Risk factors you cannot control include:   Race. You may be at higher risk if you are African American.  Age. Risk increases with age.  Gender. Men are at higher risk than women before age 45 years. After age 65, women are at higher risk than men. Risk factors you can control include:  Not getting enough exercise or physical activity.  Being overweight.  Getting too much fat, sugar, calories, or salt in your diet.  Drinking too much alcohol. SIGNS AND SYMPTOMS Hypertension does not usually cause signs or symptoms. Extremely high blood pressure (hypertensive crisis) may cause headache, anxiety, shortness of breath, and nosebleed. DIAGNOSIS To check if you have hypertension, your health care provider will measure your blood pressure while you are seated, with your arm held at the level of your heart. It should be measured at least twice using the same arm. Certain conditions can cause a difference in blood pressure between your right and left arms. A blood pressure reading that is higher than normal on one occasion does not mean that you need treatment. If  it is not clear whether you have high blood pressure, you may be asked to return on a different day to have your blood pressure checked again. Or, you may be asked to monitor your blood pressure at home for 1 or more weeks. TREATMENT Treating high blood pressure includes making lifestyle changes and possibly taking medicine. Living a healthy lifestyle can help lower high blood pressure. You may need to change some of your habits. Lifestyle changes may include:  Following the DASH diet. This diet is high in fruits, vegetables, and whole grains. It is low in salt, red meat, and added sugars.  Keep your sodium intake below 2,300 mg per day.  Getting at least 30-45 minutes of aerobic exercise at least 4 times per week.  Losing weight if necessary.  Not smoking.  Limiting alcoholic beverages.  Learning ways to reduce stress. Your health care provider may prescribe medicine if lifestyle changes are not enough to get your blood pressure under control, and if one of the following is true:  You are 18-59 years of age and your systolic blood pressure is above 140.  You are 60 years of age or older, and your systolic blood pressure is above 150.  Your diastolic blood pressure is above 90.  You have diabetes, and your systolic blood pressure is over 140 or your diastolic blood pressure is over 90.  You have kidney disease and your blood pressure is above 140/90.  You have heart disease and your blood pressure is above 140/90. Your personal target blood pressure may vary depending on your medical conditions, your age, and other factors. HOME CARE INSTRUCTIONS    Have your blood pressure rechecked as directed by your health care provider.   Take medicines only as directed by your health care provider. Follow the directions carefully. Blood pressure medicines must be taken as prescribed. The medicine does not work as well when you skip doses. Skipping doses also puts you at risk for  problems.  Do not smoke.   Monitor your blood pressure at home as directed by your health care provider. SEEK MEDICAL CARE IF:   You think you are having a reaction to medicines taken.  You have recurrent headaches or feel dizzy.  You have swelling in your ankles.  You have trouble with your vision. SEEK IMMEDIATE MEDICAL CARE IF:  You develop a severe headache or confusion.  You have unusual weakness, numbness, or feel faint.  You have severe chest or abdominal pain.  You vomit repeatedly.  You have trouble breathing. MAKE SURE YOU:   Understand these instructions.  Will watch your condition.  Will get help right away if you are not doing well or get worse.   This information is not intended to replace advice given to you by your health care provider. Make sure you discuss any questions you have with your health care provider.   Document Released: 08/25/2005 Document Revised: 01/09/2015 Document Reviewed: 06/17/2013 Elsevier Interactive Patient Education 2016 Elsevier Inc.  

## 2016-02-22 NOTE — Progress Notes (Signed)
Pt here for F/U for HTN. Pt also reports swelling in both knees.  He states that the left knee pops and gives out on him. Pt also states that he has been having headaches.  Pt needs refill on hydrochlorothiazide.

## 2016-02-22 NOTE — Progress Notes (Signed)
Subjective:  Patient ID: Phillip Riegerimothy Moustafa, male    DOB: 07/11/1966  Age: 50 y.o. MRN: [id number]  CC: Follow-up   HPI Phillip Oliver is 50 year old male with a history of hypertension who comes in today for follow-up visit. He was to receive a prescription for lisinopril/hydrochlorothiazide which he never got and his blood pressure is elevated today and he endorses having occasional headaches but no blurry vision or Chest pains.  He complains of pain in both knees and has to use a knee brace which slightly improves his symptoms but does not take any OTC medications for it. He was treated for a tooth infection sometime last month and complains that the pain subsided but then returned recently with associated swelling of his gum; he has no medical Coverage and has been unable to see a dentist.  Complains of erectile dysfunction which he would like treated.  Past Medical History  Diagnosis Date  . Hypertension     History reviewed. No pertinent past surgical history.  Allergies  Allergen Reactions  . Tylenol [Acetaminophen] Other (See Comments)    Patient states that his mother told him this "years ago" as a child     Outpatient Prescriptions Prior to Visit  Medication Sig Dispense Refill  . hydrochlorothiazide (HYDRODIURIL) 25 MG tablet Take 1 tablet (25 mg total) by mouth daily. (Patient not taking: Reported on 02/22/2016) 10 tablet 0   Facility-Administered Medications Prior to Visit  Medication Dose Route Frequency Provider Last Rate Last Dose  . aspirin tablet 325 mg  325 mg Oral Once Jaclyn ShaggyEnobong Amao, MD        ROS Review of Systems  Constitutional: Negative for activity change and appetite change.  HENT: Positive for dental problem. Negative for sinus pressure and sore throat.   Eyes: Negative for visual disturbance.  Respiratory: Negative for cough, chest tightness and shortness of breath.   Cardiovascular: Negative for chest pain and leg swelling.  Gastrointestinal:  Negative for abdominal pain, diarrhea, constipation and abdominal distention.  Endocrine: Negative.   Genitourinary: Negative for dysuria.  Musculoskeletal:       See history of present illness  Skin: Negative for rash.  Allergic/Immunologic: Negative.   Neurological: Negative for weakness, light-headedness and numbness.  Psychiatric/Behavioral: Negative for suicidal ideas and dysphoric mood.    Objective:  BP 160/117 mmHg  Pulse 84  Temp(Src) 98 F (36.7 C) (Oral)  Resp 14  Ht 6\' 7"  (2.007 m)  Wt 242 lb 3.2 oz (109.861 kg)  BMI 27.27 kg/m2  SpO2 93%  BP/Weight 02/22/2016 01/07/2016 01/07/2016  Systolic BP 160 161 175  Diastolic BP 117 105 117  Wt. (Lbs) 242.2 - 240  BMI 27.27 - 27.03      Physical Exam  Constitutional: He is oriented to person, place, and time. He appears well-developed and well-nourished.  HENT:  Presence of dental caries and cracked tooth of left lower molar and surrounding inflammation of jaw  Cardiovascular: Normal rate, normal heart sounds and intact distal pulses.   No murmur heard. Pulmonary/Chest: Effort normal and breath sounds normal. He has no wheezes. He has no rales. He exhibits no tenderness.  Abdominal: Soft. Bowel sounds are normal. He exhibits no distension and no mass. There is no tenderness.  Musculoskeletal: He exhibits tenderness (associated tenderness and crepitus on active range of motion of knees bilaterally).  Neurological: He is alert and oriented to person, place, and time.     Assessment & Plan:   1. Tooth ache He will need  to see a dentist-working on [place] card application - amoxicillin (AMOXIL) 500 MG capsule; Take 1 capsule (500 mg total) by mouth 3 (three) times daily.  Dispense: 30 capsule; Refill: 0  2. Essential hypertension Low-sodium, DASH diet - lisinopril-hydrochlorothiazide (PRINZIDE,ZESTORETIC) 20-25 MG tablet; Take 1 tablet by mouth daily.  Dispense: 30 tablet; Refill: 3 - Lipid panel  3. Primary  osteoarthritis of both knees Use knee brace as needed - naproxen (NAPROSYN) 500 MG tablet; Take 1 tablet (500 mg total) by mouth 2 (two) times daily with a meal.  Dispense: 30 tablet; Refill: 2  4. Erectile dysfunction, unspecified erectile dysfunction type - sildenafil (VIAGRA) 100 MG tablet; Take 0.5-1 tablets (50-100 mg total) by mouth daily as needed for erectile dysfunction.  Dispense: 5 tablet; Refill: 0   Meds ordered this encounter  Medications  . lisinopril-hydrochlorothiazide (PRINZIDE,ZESTORETIC) 20-25 MG tablet    Sig: Take 1 tablet by mouth daily.    Dispense:  30 tablet    Refill:  3  . naproxen (NAPROSYN) 500 MG tablet    Sig: Take 1 tablet (500 mg total) by mouth 2 (two) times daily with a meal.    Dispense:  30 tablet    Refill:  2  . amoxicillin (AMOXIL) 500 MG capsule    Sig: Take 1 capsule (500 mg total) by mouth 3 (three) times daily.    Dispense:  30 capsule    Refill:  0  . sildenafil (VIAGRA) 100 MG tablet    Sig: Take 0.5-1 tablets (50-100 mg total) by mouth daily as needed for erectile dysfunction.    Dispense:  5 tablet    Refill:  0    Follow-up: Return in about 2 weeks (around 03/07/2016) for follow up on Hypertension.   Jaclyn Shaggy MD

## 2016-02-25 ENCOUNTER — Telehealth: Payer: Self-pay

## 2016-02-25 ENCOUNTER — Other Ambulatory Visit: Payer: Self-pay | Admitting: Family Medicine

## 2016-02-25 DIAGNOSIS — E785 Hyperlipidemia, unspecified: Secondary | ICD-10-CM

## 2016-02-25 MED ORDER — ATORVASTATIN CALCIUM 20 MG PO TABS: 20.0000 mg | ORAL_TABLET | Freq: Every day | ORAL | Status: DC

## 2016-02-25 MED FILL — ?ATORVASTATIN 20 MG TABLET: 20 | 30 days supply | Qty: 30 | Fill #0

## 2016-02-25 NOTE — Telephone Encounter (Signed)
Writer called patient and gave him the lab results and Dr. Jen MowAmao's recommendation to start Lipitor.  Patient became upset because he doesn't "have the money for anymore medications.  Diane told him all his medications will be free".  Patient encouraged to call Diane tomorrow to discuss.

## 2016-02-25 NOTE — Telephone Encounter (Signed)
-----   Message from Jaclyn ShaggyEnobong Amao, MD sent at 02/25/2016  8:39 AM EDT ----- Lipids are elevated and I have sent a rx for atorvastatin as his cardiovascular risk is high. Advised to work on lifestyle management, low cholesterol diet.

## 2016-03-26 MED FILL — NAPROXEN 500 MG TABLET: 500 | 30 days supply | Qty: 30 | Fill #1

## 2016-03-26 MED FILL — LISINOPRIL-HCTZ 20-25 MG TA: 20-25 | 30 days supply | Qty: 30 | Fill #1

## 2016-03-31 ENCOUNTER — Ambulatory Visit: Payer: Self-pay | Attending: Family Medicine | Admitting: Family Medicine

## 2016-03-31 ENCOUNTER — Encounter: Payer: Self-pay | Admitting: Family Medicine

## 2016-03-31 VITALS — BP 165/110 | HR 73 | Temp 97.5°F | Ht 78.5 in | Wt 248.4 lb

## 2016-03-31 DIAGNOSIS — Z79899 Other long term (current) drug therapy: Secondary | ICD-10-CM | POA: Insufficient documentation

## 2016-03-31 DIAGNOSIS — F17200 Nicotine dependence, unspecified, uncomplicated: Secondary | ICD-10-CM

## 2016-03-31 DIAGNOSIS — F17210 Nicotine dependence, cigarettes, uncomplicated: Secondary | ICD-10-CM | POA: Insufficient documentation

## 2016-03-31 DIAGNOSIS — M1710 Unilateral primary osteoarthritis, unspecified knee: Secondary | ICD-10-CM | POA: Insufficient documentation

## 2016-03-31 DIAGNOSIS — E785 Hyperlipidemia, unspecified: Secondary | ICD-10-CM | POA: Insufficient documentation

## 2016-03-31 DIAGNOSIS — M170 Bilateral primary osteoarthritis of knee: Secondary | ICD-10-CM

## 2016-03-31 DIAGNOSIS — I10 Essential (primary) hypertension: Secondary | ICD-10-CM | POA: Insufficient documentation

## 2016-03-31 MED FILL — ATORVASTATIN 20 MG TABLET: 20 | 30 days supply | Qty: 30 | Fill #1

## 2016-03-31 NOTE — Progress Notes (Signed)
Hasn't had BP since last Tuesday Medication refills

## 2016-03-31 NOTE — Patient Instructions (Signed)
Smoking Cessation, Tips for Success If you are ready to quit smoking, congratulations! You have chosen to help yourself be healthier. Cigarettes bring nicotine, tar, carbon monoxide, and other irritants into your body. Your lungs, heart, and blood vessels will be able to work better without these poisons. There are many different ways to quit smoking. Nicotine gum, nicotine patches, a nicotine inhaler, or nicotine nasal spray can help with physical craving. Hypnosis, support groups, and medicines help break the habit of smoking. WHAT THINGS CAN I DO TO MAKE QUITTING EASIER?  Here are some tips to help you quit for good:  Pick a date when you will quit smoking completely. Tell all of your friends and family about your plan to quit on that date.  Do not try to slowly cut down on the number of cigarettes you are smoking. Pick a quit date and quit smoking completely starting on that day.  Throw away all cigarettes.   Clean and remove all ashtrays from your home, work, and car.  On a card, write down your reasons for quitting. Carry the card with you and read it when you get the urge to smoke.  Cleanse your body of nicotine. Drink enough water and fluids to keep your urine clear or pale yellow. Do this after quitting to flush the nicotine from your body.  Learn to predict your moods. Do not let a bad situation be your excuse to have a cigarette. Some situations in your life might tempt you into wanting a cigarette.  Never have "just one" cigarette. It leads to wanting another and another. Remind yourself of your decision to quit.  Change habits associated with smoking. If you smoked while driving or when feeling stressed, try other activities to replace smoking. Stand up when drinking your coffee. Brush your teeth after eating. Sit in a different chair when you read the paper. Avoid alcohol while trying to quit, and try to drink fewer caffeinated beverages. Alcohol and caffeine may urge you to  smoke.  Avoid foods and drinks that can trigger a desire to smoke, such as sugary or spicy foods and alcohol.  Ask people who smoke not to smoke around you.  Have something planned to do right after eating or having a cup of coffee. For example, plan to take a walk or exercise.  Try a relaxation exercise to calm you down and decrease your stress. Remember, you may be tense and nervous for the first 2 weeks after you quit, but this will pass.  Find new activities to keep your hands busy. Play with a pen, coin, or rubber band. Doodle or draw things on paper.  Brush your teeth right after eating. This will help cut down on the craving for the taste of tobacco after meals. You can also try mouthwash.   Use oral substitutes in place of cigarettes. Try using lemon drops, carrots, cinnamon sticks, or chewing gum. Keep them handy so they are available when you have the urge to smoke.  When you have the urge to smoke, try deep breathing.  Designate your home as a nonsmoking area.  If you are a heavy smoker, ask your health care provider about a prescription for nicotine chewing gum. It can ease your withdrawal from nicotine.  Reward yourself. Set aside the cigarette money you save and buy yourself something nice.  Look for support from others. Join a support group or smoking cessation program. Ask someone at home or at work to help you with your plan   to quit smoking.  Always ask yourself, "Do I need this cigarette or is this just a reflex?" Tell yourself, "Today, I choose not to smoke," or "I do not want to smoke." You are reminding yourself of your decision to quit.  Do not replace cigarette smoking with electronic cigarettes (commonly called e-cigarettes). The safety of e-cigarettes is unknown, and some may contain harmful chemicals.  If you relapse, do not give up! Plan ahead and think about what you will do the next time you get the urge to smoke. HOW WILL I FEEL WHEN I QUIT SMOKING? You  may have symptoms of withdrawal because your body is used to nicotine (the addictive substance in cigarettes). You may crave cigarettes, be irritable, feel very hungry, cough often, get headaches, or have difficulty concentrating. The withdrawal symptoms are only temporary. They are strongest when you first quit but will go away within 10-14 days. When withdrawal symptoms occur, stay in control. Think about your reasons for quitting. Remind yourself that these are signs that your body is healing and getting used to being without cigarettes. Remember that withdrawal symptoms are easier to treat than the major diseases that smoking can cause.  Even after the withdrawal is over, expect periodic urges to smoke. However, these cravings are generally short lived and will go away whether you smoke or not. Do not smoke! WHAT RESOURCES ARE AVAILABLE TO HELP ME QUIT SMOKING? Your health care provider can direct you to community resources or hospitals for support, which may include:  Group support.  Education.  Hypnosis.  Therapy.   This information is not intended to replace advice given to you by your health care provider. Make sure you discuss any questions you have with your health care provider.   Document Released: 05/23/2004 Document Revised: 09/15/2014 Document Reviewed: 02/10/2013 Elsevier Interactive Patient Education 2016 Elsevier Inc.  

## 2016-03-31 NOTE — Progress Notes (Signed)
CC: Follow-up on hypertension  HPI: Phillip Oliver is a 50 y.o. male here today for a follow up visit.  Patient has past medical history of hypertension, hyperlipidemia, tobacco abuse, osteoarthritis of the knee. Blood pressure is elevated today and he admits to being out of his antihypertensives for the last 1 week and was told by the pharmacy he would need 48 hours for this to be processed.  He has been compliant with his atorvastatin and is working on a low-cholesterol diet.  Continues to have pain in both knees more with climbing stairs and remains on naproxen for this; he recently ran out of naproxen which did help his symptoms.  Patient has No headache, No chest pain, No abdominal pain - No Nausea, No new weakness tingling or numbness, No Cough - SOB.  Allergies  Allergen Reactions  . Tylenol [Acetaminophen] Other (See Comments)    Patient states that his mother told him this "years ago" as a child   Past Medical History:  Diagnosis Date  . Hypertension    Current Outpatient Prescriptions on File Prior to Visit  Medication Sig Dispense Refill  . atorvastatin (LIPITOR) 20 MG tablet Take 1 tablet (20 mg total) by mouth daily. 30 tablet 3  . lisinopril-hydrochlorothiazide (PRINZIDE,ZESTORETIC) 20-25 MG tablet Take 1 tablet by mouth daily. (Patient not taking: Reported on 03/31/2016) 30 tablet 3  . naproxen (NAPROSYN) 500 MG tablet Take 1 tablet (500 mg total) by mouth 2 (two) times daily with a meal. (Patient not taking: Reported on 03/31/2016) 30 tablet 2  . sildenafil (VIAGRA) 100 MG tablet Take 0.5-1 tablets (50-100 mg total) by mouth daily as needed for erectile dysfunction. (Patient not taking: Reported on 03/31/2016) 5 tablet 0   Current Facility-Administered Medications on File Prior to Visit  Medication Dose Route Frequency Provider Last Rate Last Dose  . aspirin tablet 325 mg  325 mg Oral Once Jaclyn Shaggy, MD       No family history on file. Social History   Social  History  . Marital status: Single    Spouse name: N/A  . Number of children: N/A  . Years of education: N/A   Occupational History  . Not on file.   Social History Main Topics  . Smoking status: Current Every Day Smoker    Packs/day: 0.50    Types: Cigarettes  . Smokeless tobacco: Not on file  . Alcohol use Yes     Comment: "As much as I can get"   . Drug use: No  . Sexual activity: Not on file   Other Topics Concern  . Not on file   Social History Narrative  . No narrative on file    Review of Systems: Constitutional: Negative for fever, chills, diaphoresis, activity change, appetite change and fatigue. HENT: Negative for ear pain, nosebleeds, congestion, facial swelling, rhinorrhea, neck pain, neck stiffness and ear discharge.  Eyes: Negative for pain, discharge, redness, itching and visual disturbance. Respiratory: Negative for cough, choking, chest tightness, shortness of breath, wheezing and stridor.  Cardiovascular: Negative for chest pain, palpitations and leg swelling. Gastrointestinal: Negative for abdominal distention. Genitourinary: Negative for dysuria, urgency, frequency, hematuria, flank pain, decreased urine volume, difficulty urinating and dyspareunia.  Musculoskeletal: Bilateral knee pain Neurological: Negative for dizziness, tremors, seizures, syncope, facial asymmetry, speech difficulty, weakness, light-headedness, numbness and headaches.  Hematological: Negative for adenopathy. Does not bruise/bleed easily. Psychiatric/Behavioral: Negative for hallucinations, behavioral problems, confusion, dysphoric mood, decreased concentration and agitation.    Objective:  Vitals:   03/31/16 0942  BP: (!) 165/110  Pulse: 73  Temp: 97.5 F (36.4 C)    Physical Exam: Constitutional: Patient appears well-developed and well-nourished. No distress. HENT: Normocephalic, atraumatic, External right and left ear normal. Oropharynx is clear and moist.  Eyes:  Conjunctivae and EOM are normal. PERRLA, no scleral icterus. Neck: Normal ROM. Neck supple. No JVD. No tracheal deviation. No thyromegaly. CVS: RRR, S1/S2 +, no murmurs, no gallops, no carotid bruit.  Pulmonary: Effort and breath sounds normal, no stridor, rhonchi, wheezes, rales.  Abdominal: Soft. BS +,  no distension, tenderness, rebound or guarding.  Musculoskeletal: Crepitus on range of motion of both knees  Lymphadenopathy: No lymphadenopathy noted, cervical, inguinal or axillary Neuro: Alert. Normal reflexes, muscle tone coordination. No cranial nerve deficit. Skin: Skin is warm and dry. No rash noted. Not diaphoretic. No erythema. No pallor. Psychiatric: Normal mood and affect. Behavior, judgment, thought content normal.  Lab Results  Component Value Date   WBC 4.4 01/07/2016   HGB 13.6 01/07/2016   HCT 40.8 01/07/2016   MCV 91.7 01/07/2016   PLT 220 01/07/2016   Lab Results  Component Value Date   CREATININE 1.09 01/07/2016   BUN 10 01/07/2016   NA 142 01/07/2016   K 4.2 01/07/2016   CL 108 01/07/2016   CO2 25 01/07/2016    No results found for: HGBA1C Lipid Panel     Component Value Date/Time   CHOL 218 (H) 02/22/2016 0959   TRIG 126 02/22/2016 0959   HDL 63 02/22/2016 0959   CHOLHDL 3.5 02/22/2016 0959   VLDL 25 02/22/2016 0959   LDLCALC 130 (H) 02/22/2016 0959       Assessment and plan:   Hypertention: Uncontrolled due to running out of medications Advised that he will need to call the pharmacy once he runs low on medication so refill [place] process rather than waiting until he is completely out. Low-sodium, DASH diet  Hyperlipidemia: Continue atorvastatin Low cholesterol diet Exercise and other lifestyle modifications.  Osteoarthritis of the knee: Continue naproxen Knee brace as needed  Tobacco abuse: He is working on cessation. Spent 3 minutes counseling on cessation and he is willing to quit but would like to do it cold [place].    Jaclyn Shaggy, MD. [facility] [phone] 03/31/2016, 9:54 AM

## 2016-05-05 MED FILL — NAPROXEN 500 MG TABLET: 500 | 30 days supply | Qty: 30 | Fill #2

## 2016-05-05 MED FILL — LISINOPRIL-HCTZ 20-25 MG TA: 20-25 | 30 days supply | Qty: 30 | Fill #2

## 2016-05-05 MED FILL — ?ATORVASTATIN 20 MG TABLET: 20 | 30 days supply | Qty: 30 | Fill #2

## 2016-06-11 ENCOUNTER — Encounter: Payer: Self-pay | Admitting: Family Medicine

## 2016-06-11 ENCOUNTER — Other Ambulatory Visit: Payer: Self-pay | Admitting: Family Medicine

## 2016-06-11 ENCOUNTER — Ambulatory Visit: Payer: Self-pay | Attending: Family Medicine | Admitting: Family Medicine

## 2016-06-11 ENCOUNTER — Ambulatory Visit (HOSPITAL_COMMUNITY)
Admission: RE | Admit: 2016-06-11 | Discharge: 2016-06-11 | Disposition: A | Discharge status: Home / Self Care | Payer: Self-pay | Source: Ambulatory Visit | Attending: Family Medicine | Admitting: Family Medicine

## 2016-06-11 DIAGNOSIS — M25561 Pain in right knee: Secondary | ICD-10-CM | POA: Insufficient documentation

## 2016-06-11 DIAGNOSIS — M170 Bilateral primary osteoarthritis of knee: Secondary | ICD-10-CM

## 2016-06-11 DIAGNOSIS — M1712 Unilateral primary osteoarthritis, left knee: Secondary | ICD-10-CM | POA: Insufficient documentation

## 2016-06-11 DIAGNOSIS — N529 Male erectile dysfunction, unspecified: Secondary | ICD-10-CM | POA: Insufficient documentation

## 2016-06-11 DIAGNOSIS — E7849 Other hyperlipidemia: Secondary | ICD-10-CM

## 2016-06-11 DIAGNOSIS — I10 Essential (primary) hypertension: Secondary | ICD-10-CM | POA: Insufficient documentation

## 2016-06-11 DIAGNOSIS — E785 Hyperlipidemia, unspecified: Secondary | ICD-10-CM | POA: Insufficient documentation

## 2016-06-11 DIAGNOSIS — E784 Other hyperlipidemia: Secondary | ICD-10-CM

## 2016-06-11 MED ORDER — NAPROXEN 500 MG PO TABS: ORAL_TABLET | ORAL | 3 refills | Status: DC

## 2016-06-11 MED ORDER — SILDENAFIL CITRATE 100 MG PO TABS: 50.0000 mg | ORAL_TABLET | Freq: Every day | ORAL | 0 refills | Status: DC | PRN

## 2016-06-11 MED ORDER — TRAMADOL HCL 50 MG PO TABS: 50.0000 mg | ORAL_TABLET | Freq: Two times a day (BID) | ORAL | 0 refills | Status: DC | PRN

## 2016-06-11 MED ORDER — LISINOPRIL-HYDROCHLOROTHIAZIDE 20-25 MG PO TABS: 1.0000 | ORAL_TABLET | Freq: Every day | ORAL | 5 refills | Status: DC

## 2016-06-11 MED ORDER — KETOROLAC TROMETHAMINE 60 MG/2ML IM SOLN
60.0000 mg | Freq: Once | INTRAMUSCULAR | Status: AC
  Administered 2016-06-11: 60 mg via INTRAMUSCULAR

## 2016-06-11 MED ORDER — ATORVASTATIN CALCIUM 20 MG PO TABS: 20.0000 mg | ORAL_TABLET | Freq: Every day | ORAL | 5 refills | Status: DC

## 2016-06-11 MED FILL — LISINOPRIL-HCTZ 20-25 MG TA: 20-25 | 30 days supply | Qty: 30 | Fill #3

## 2016-06-11 MED FILL — !VIAGRA 100MG TABLET: 100 | 5 days supply | Qty: 1 | Fill #0

## 2016-06-11 MED FILL — traMADol HCL 50 MG TABS: 50 | 30 days supply | Qty: 60 | Fill #0

## 2016-06-11 MED FILL — ATORVASTATIN 20 MG TABLET: 20 | 30 days supply | Qty: 30 | Fill #3

## 2016-06-11 MED FILL — NAPROXEN 500 MG TABLET: 500 | 30 days supply | Qty: 30 | Fill #0

## 2016-06-11 NOTE — Progress Notes (Signed)
Subjective:  Patient ID: Phillip Oliver, male    DOB: [birth year]  Age: 50 y.o. MRN: [id number]  CC: Knee Pain (swelling) and Hypertension   HPI Phillip Oliver is a 50 year old male with a history of hypertension, hyperlipidemia, bilateral knee pain who comes into the clinic for a follow-up visit.  He has been compliant with his antihypertensive and his statin and is adhering to a low-cholesterol, low-sodium diet. He complains of severe bilateral knee pain which is not controlled on naproxen; he uses bilateral knee braces but pain is worse with going up and down stairs and he is requesting something stronger for his pain.  He has no other complains at this time. Also requests a refill of Viagra.  Past Medical History:  Diagnosis Date  . Hypertension     History reviewed. No pertinent surgical history.  Allergies  Allergen Reactions  . Tylenol [Acetaminophen] Other (See Comments)    Patient states that his mother told him this "years ago" as a child     Outpatient Medications Prior to Visit  Medication Sig Dispense Refill  . atorvastatin (LIPITOR) 20 MG tablet Take 1 tablet (20 mg total) by mouth daily. 30 tablet 3  . lisinopril-hydrochlorothiazide (PRINZIDE,ZESTORETIC) 20-25 MG tablet Take 1 tablet by mouth daily. 30 tablet 3  . sildenafil (VIAGRA) 100 MG tablet Take 0.5-1 tablets (50-100 mg total) by mouth daily as needed for erectile dysfunction. (Patient not taking: Reported on 06/11/2016) 5 tablet 0  . aspirin tablet 325 mg      No facility-administered medications prior to visit.     ROS Review of Systems  Constitutional: Negative for activity change and appetite change.  HENT: Negative for sinus pressure and sore throat.   Eyes: Negative for visual disturbance.  Respiratory: Negative for cough, chest tightness and shortness of breath.   Cardiovascular: Negative for chest pain and leg swelling.  Gastrointestinal: Negative for abdominal distention, abdominal pain,  constipation and diarrhea.  Endocrine: Negative.   Genitourinary: Negative for dysuria.  Musculoskeletal: Negative for joint swelling and myalgias.       See hpi  Skin: Negative for rash.  Allergic/Immunologic: Negative.   Neurological: Negative for weakness, light-headedness and numbness.  Psychiatric/Behavioral: Negative for dysphoric mood and suicidal ideas.     Objective:  BP 118/77 (BP Location: Right Arm, Patient Position: Sitting, Cuff Size: Large)   Pulse (!) 58   Temp 97.6 F (36.4 C) (Oral)   Ht 6\' 7"  (2.007 m)   Wt 252 lb 12.8 oz (114.7 kg)   SpO2 99%   BMI 28.48 kg/m   BP/Weight 06/11/2016 03/31/2016 02/22/2016  Systolic BP 118 165 160  Diastolic BP 77 110 117  Wt. (Lbs) 252.8 248.4 242.2  BMI 28.48 28.34 27.27      Physical Exam Constitutional: Patient appears well-developed and well-nourished. No distress. HENT: Normocephalic, atraumatic, External right and left ear normal. Oropharynx is clear and moist.  Eyes: Conjunctivae and EOM are normal. PERRLA, no scleral icterus. Neck: Normal ROM. Neck supple. No JVD. No tracheal deviation. No thyromegaly. CVS: Bradycardic rate, regular rate, S1/S2 +, no murmurs, no gallops, no carotid bruit.  Pulmonary: Effort and breath sounds normal, no stridor, rhonchi, wheezes, rales.  Abdominal: Soft. BS +,  no distension, tenderness, rebound or guarding.  Musculoskeletal: Crepitus on range of motion of both knees with associated tenderness Lymphadenopathy: No lymphadenopathy noted, cervical, inguinal or axillary Neuro: Alert. Normal reflexes, muscle tone coordination. No cranial nerve deficit. Skin: Skin is warm and dry. No rash  noted. Not diaphoretic. No erythema. No pallor. Psychiatric: Normal mood and affect. Behavior, judgment, thought content normal.  CMP Latest Ref Rng & Units 01/07/2016 12/23/2015  Glucose 65 - 99 mg/dL 92 161(W101(H)  BUN 6 - 20 mg/dL 10 7  Creatinine 9.600.61 - 1.24 mg/dL 4.541.09 0.980.96  Sodium 119135 - 145 mmol/L 142  138  Potassium 3.5 - 5.1 mmol/L 4.2 3.8  Chloride 101 - 111 mmol/L 108 104  CO2 22 - 32 mmol/L 25 24  Calcium 8.9 - 10.3 mg/dL 8.9 1.4(N8.7(L)    Lipid Panel     Component Value Date/Time   CHOL 218 (H) 02/22/2016 0959   TRIG 126 02/22/2016 0959   HDL 63 02/22/2016 0959   CHOLHDL 3.5 02/22/2016 0959   VLDL 25 02/22/2016 0959   LDLCALC 130 (H) 02/22/2016 0959    Assessment & Plan:   1. Other hyperlipidemia Uncontrolled - atorvastatin (LIPITOR) 20 MG tablet; Take 1 tablet (20 mg total) by mouth daily.  Dispense: 30 tablet; Refill: 5  2. Essential hypertension Controlled - lisinopril-hydrochlorothiazide (PRINZIDE,ZESTORETIC) 20-25 MG tablet; Take 1 tablet by mouth daily.  Dispense: 30 tablet; Refill: 5  3. Primary osteoarthritis of both knees Uncontrolled Referred for x-rays of the knee. Intramuscular Toradol 60 mg administered today. We'll place on tramadol as needed. We have discussed steroid injections and the patient is willing to proceed with this if conservative measures fail. I will perform bilateral knee injections at his next visit if still symptomatic. - traMADol (ULTRAM) 50 MG tablet; Take 1 tablet (50 mg total) by mouth every 12 (twelve) hours as needed.  Dispense: 60 tablet; Refill: 0 - naproxen (NAPROSYN) 500 MG tablet; Take 1 tablet by mouth 2 times daily with a meal..  Dispense: 60 tablet; Refill: 3 - DG Knee Complete 4 Views Left; Future - DG Knee Complete 4 Views Right; Future - ketorolac (TORADOL) injection 60 mg; Inject 2 mLs (60 mg total) into the muscle once.  4. Erectile dysfunction, unspecified erectile dysfunction type - sildenafil (VIAGRA) 100 MG tablet; Take 0.5-1 tablets (50-100 mg total) by mouth daily as needed for erectile dysfunction.  Dispense: 5 tablet; Refill: 0   Meds ordered this encounter  Medications  . traMADol (ULTRAM) 50 MG tablet    Sig: Take 1 tablet (50 mg total) by mouth every 12 (twelve) hours as needed.    Dispense:  60 tablet     Refill:  0  . atorvastatin (LIPITOR) 20 MG tablet    Sig: Take 1 tablet (20 mg total) by mouth daily.    Dispense:  30 tablet    Refill:  5  . lisinopril-hydrochlorothiazide (PRINZIDE,ZESTORETIC) 20-25 MG tablet    Sig: Take 1 tablet by mouth daily.    Dispense:  30 tablet    Refill:  5  . naproxen (NAPROSYN) 500 MG tablet    Sig: Take 1 tablet by mouth 2 times daily with a meal..    Dispense:  60 tablet    Refill:  3  . sildenafil (VIAGRA) 100 MG tablet    Sig: Take 0.5-1 tablets (50-100 mg total) by mouth daily as needed for erectile dysfunction.    Dispense:  5 tablet    Refill:  0  . ketorolac (TORADOL) injection 60 mg    Follow-up: Return in about 3 months (around 09/11/2016) for Follow-up on hypertension and hyperlipidemia.   Jaclyn ShaggyEnobong Amao MD

## 2016-06-11 NOTE — Progress Notes (Signed)
Medication refills

## 2016-06-11 NOTE — Patient Instructions (Signed)

## 2016-06-13 ENCOUNTER — Telehealth: Payer: Self-pay
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
## 2016-06-13 NOTE — Telephone Encounter (Signed)
Called patient per Dr. Venetia NightAmao to give him results of his left knee xray.  Patient stated at this time he continues to have pain however the tramadol is helping and he has had to take 2 tabs several times to help with the pain.  Patient has a f/u appt in January and will call if he needs to be seen sooner.

## 2016-07-25 ENCOUNTER — Other Ambulatory Visit: Payer: Self-pay | Admitting: Family Medicine

## 2016-07-25 DIAGNOSIS — M170 Bilateral primary osteoarthritis of knee: Secondary | ICD-10-CM

## 2016-07-28 ENCOUNTER — Telehealth: Payer: Self-pay

## 2016-07-28 NOTE — Telephone Encounter (Signed)
Writer called patient to let him know his tramadol prescription is ready for pick up.

## 2016-08-12 MED FILL — LISINOPRIL-HCTZ 20-25 MG TA: 20-25 | 30 days supply | Qty: 30 | Fill #0

## 2016-08-12 MED FILL — NAPROXEN 500 MG TABLET: 500 | 30 days supply | Qty: 60 | Fill #0

## 2016-08-12 MED FILL — ATORVASTATIN 20 MG TABLET: 20 | 30 days supply | Qty: 30 | Fill #0

## 2016-08-13 ENCOUNTER — Other Ambulatory Visit: Payer: Self-pay | Admitting: Family Medicine

## 2016-08-13 DIAGNOSIS — M170 Bilateral primary osteoarthritis of knee: Secondary | ICD-10-CM

## 2016-08-13 MED FILL — traMADol HCL 50 MG TABS: 50 | 30 days supply | Qty: 60 | Fill #0

## 2016-08-22 ENCOUNTER — Emergency Department (HOSPITAL_COMMUNITY): Payer: Self-pay

## 2016-08-22 ENCOUNTER — Emergency Department (HOSPITAL_COMMUNITY)
Admission: EM | Admit: 2016-08-22 | Discharge: 2016-08-22 | Disposition: A | Discharge status: Home / Self Care | Payer: Self-pay | Attending: Emergency Medicine | Admitting: Emergency Medicine

## 2016-08-22 ENCOUNTER — Encounter (HOSPITAL_COMMUNITY): Payer: Self-pay | Admitting: Emergency Medicine

## 2016-08-22 DIAGNOSIS — F17210 Nicotine dependence, cigarettes, uncomplicated: Secondary | ICD-10-CM | POA: Insufficient documentation

## 2016-08-22 DIAGNOSIS — R1013 Epigastric pain: Secondary | ICD-10-CM | POA: Insufficient documentation

## 2016-08-22 DIAGNOSIS — Z79899 Other long term (current) drug therapy: Secondary | ICD-10-CM | POA: Insufficient documentation

## 2016-08-22 DIAGNOSIS — I10 Essential (primary) hypertension: Secondary | ICD-10-CM | POA: Insufficient documentation

## 2016-08-22 DIAGNOSIS — R109 Unspecified abdominal pain: Secondary | ICD-10-CM

## 2016-08-22 LAB — COMPREHENSIVE METABOLIC PANEL
ALT: 10 U/L — ABNORMAL LOW (ref 17–63)
AST: 20 U/L (ref 15–41)
Albumin: 3.6 g/dL (ref 3.5–5.0)
Alkaline Phosphatase: 51 U/L (ref 38–126)
Anion gap: 11 (ref 5–15)
BILIRUBIN TOTAL: 0.9 mg/dL (ref 0.3–1.2)
BUN: 16 mg/dL (ref 6–20)
CO2: 26 mmol/L (ref 22–32)
Calcium: 9 mg/dL (ref 8.9–10.3)
Chloride: 96 mmol/L — ABNORMAL LOW (ref 101–111)
Creatinine, Ser: 1.32 mg/dL — ABNORMAL HIGH (ref 0.61–1.24)
GFR calc Af Amer: >60 mL/min (ref >60)
Glucose, Bld: 92 mg/dL (ref 65–99)
POTASSIUM: 3.4 mmol/L — AB (ref 3.5–5.1)
Sodium: 133 mmol/L — ABNORMAL LOW (ref 135–145)
TOTAL PROTEIN: 7.6 g/dL (ref 6.5–8.1)

## 2016-08-22 LAB — URINALYSIS, ROUTINE W REFLEX MICROSCOPIC
BILIRUBIN URINE: NEGATIVE
GLUCOSE, UA: NEGATIVE mg/dL
Ketones, ur: 80 mg/dL — AB
LEUKOCYTES UA: NEGATIVE
NITRITE: NEGATIVE
PH: 5 (ref 5.0–8.0)
Protein, ur: 100 mg/dL — AB
SPECIFIC GRAVITY, URINE: 1.032 — AB (ref 1.005–1.030)

## 2016-08-22 LAB — CBC WITH DIFFERENTIAL/PLATELET
BASOS ABS: 0 10*3/uL (ref 0.0–0.1)
BASOS PCT: 0 %
EOS PCT: 0 %
Eosinophils Absolute: 0 10*3/uL (ref 0.0–0.7)
HEMATOCRIT: 44.5 % (ref 39.0–52.0)
Hemoglobin: 15.2 g/dL (ref 13.0–17.0)
Lymphocytes Relative: 27 %
Lymphs Abs: 1 10*3/uL (ref 0.7–4.0)
MCH: 30.5 pg (ref 26.0–34.0)
MCHC: 34.2 g/dL (ref 30.0–36.0)
MCV: 89.2 fL (ref 78.0–100.0)
MONO ABS: 0.2 10*3/uL (ref 0.1–1.0)
Monocytes Relative: 6 %
NEUTROS ABS: 2.5 10*3/uL (ref 1.7–7.7)
Neutrophils Relative %: 67 %
PLATELETS: 162 10*3/uL (ref 150–400)
RBC: 4.99 MIL/uL (ref 4.22–5.81)
RDW: 13.8 % (ref 11.5–15.5)
WBC: 3.8 10*3/uL — ABNORMAL LOW (ref 4.0–10.5)

## 2016-08-22 LAB — LIPASE, BLOOD: LIPASE: 14 U/L (ref 11–51)

## 2016-08-22 MED ORDER — SODIUM CHLORIDE 0.9 % IV BOLUS (SEPSIS)
500.0000 mL | Freq: Once | INTRAVENOUS | Status: AC
  Administered 2016-08-22: 500 mL via INTRAVENOUS

## 2016-08-22 MED ORDER — SODIUM CHLORIDE 0.9 % IV BOLUS (SEPSIS)
1000.0000 mL | Freq: Once | INTRAVENOUS | Status: AC
Start: 2016-08-22 — End: 2016-08-22
  Administered 2016-08-22: 1000 mL via INTRAVENOUS

## 2016-08-22 MED ORDER — DICYCLOMINE HCL 10 MG/ML IM SOLN
20.0000 mg | Freq: Once | INTRAMUSCULAR | Status: DC
  Filled 2016-08-22: qty 2

## 2016-08-22 MED ORDER — ONDANSETRON 4 MG PO TBDP: 4.0000 mg | ORAL_TABLET | Freq: Three times a day (TID) | ORAL | 0 refills | Status: DC | PRN

## 2016-08-22 MED ORDER — DICYCLOMINE HCL 20 MG PO TABS: 20.0000 mg | ORAL_TABLET | Freq: Two times a day (BID) | ORAL | 0 refills | Status: DC | PRN

## 2016-08-22 MED ORDER — MORPHINE SULFATE (PF) 4 MG/ML IV SOLN
4.0000 mg | Freq: Once | INTRAVENOUS | Status: AC
  Administered 2016-08-22: 4 mg via INTRAVENOUS
  Filled 2016-08-22: qty 1

## 2016-08-22 MED ORDER — KETOROLAC TROMETHAMINE 30 MG/ML IJ SOLN
30.0000 mg | Freq: Once | INTRAMUSCULAR | Status: AC
  Administered 2016-08-22: 30 mg via INTRAVENOUS
  Filled 2016-08-22: qty 1

## 2016-08-22 MED ORDER — GI COCKTAIL ~~LOC~~
30.0000 mL | Freq: Once | ORAL | Status: AC
  Administered 2016-08-22: 30 mL via ORAL
  Filled 2016-08-22: qty 30

## 2016-08-22 NOTE — ED Notes (Signed)
Patient transported to Ultrasound 

## 2016-08-22 NOTE — Discharge Instructions (Signed)
Bentyl as needed for abdominal cramping. Zofran as needed for nausea.  Follow up with your primary care provider.  Please seek immediate care if you develop any of the following symptoms: The pain does not go away.  You have a fever.  You keep throwing up (vomiting).  You pass bloody or black tarry stools.  There is bright red blood in the stool.  There is rectal pain.  You do not seem to be getting better.  You have any questions or concerns.

## 2016-08-22 NOTE — ED Triage Notes (Signed)
Per EMS, pt has been having abdominal pain aches everywhere the past 2 days with cramps, loss of appetite and diarrhea x3. No N&V. Hx of HTN. EMS VS BP 152/102 (wife states that this is low for him), 96 P, 96%, 18 Rr. CBG 116.

## 2016-08-22 NOTE — ED Notes (Signed)
Pt placed back on monitor at this time.

## 2016-08-22 NOTE — ED Provider Notes (Signed)
[facility]-EMERGENCY DEPT Provider Note   CSN: [id number] Arrival date & time: 08/22/16  0548     History   Chief Complaint Chief Complaint  Patient presents with  . Abdominal Pain    HPI Phillip Oliver is a 50 y.o. male.  The history is provided by the patient and medical records. No language interpreter was used.   Phillip Oliver is a 50 y.o. male  with a PMH of HTN, HLD who presents to the Emergency Department complaining of acute onset of generalized abdominal pain (worst at epigastrium) that began Wednesday afternoon (2 days ago) and described as cramping, squeezing. Associated symptoms include non-bloody diarrhea and subjective fever yesterday that has now resolved. No medications taken prior to arrival for symptoms. No alleviating or aggravating factors. No new foods or recent travel. No sick contacts. No hx of similar sxs. Denies chest pain, shortness of breath, back pain, dysuria, n/v.    Past Medical History:  Diagnosis Date  . Hypertension     Patient Active Problem List   Diagnosis Date Noted  . Hyperlipidemia 02/25/2016  . Osteoarthritis 02/22/2016  . Erectile dysfunction 02/22/2016  . Essential hypertension 12/24/2015  . Tooth ache 12/24/2015  . Tobacco use disorder 12/24/2015    History reviewed. No pertinent surgical history.     Home Medications    Prior to Admission medications   Medication Sig Start Date End Date Taking? Authorizing Provider  atorvastatin (LIPITOR) 20 MG tablet Take 1 tablet (20 mg total) by mouth daily. 06/11/16  Yes Jaclyn Shaggy, MD  lisinopril-hydrochlorothiazide (PRINZIDE,ZESTORETIC) 20-25 MG tablet Take 1 tablet by mouth daily. 06/11/16  Yes Jaclyn Shaggy, MD  sildenafil (VIAGRA) 100 MG tablet Take 0.5-1 tablets (50-100 mg total) by mouth daily as needed for erectile dysfunction. 06/11/16  Yes Jaclyn Shaggy, MD  naproxen (NAPROSYN) 500 MG tablet Take 1 tablet by mouth 2 times daily with a meal.. Patient not taking: Reported on  08/22/2016 06/11/16   Jaclyn Shaggy, MD  traMADol (ULTRAM) 50 MG tablet TAKE 1 TABLET BY MOUTH EVERY 12 HOURS Patient not taking: Reported on 08/22/2016 07/28/16   Jaclyn Shaggy, MD    Family History No family history on file.  Social History Social History  Substance Use Topics  . Smoking status: Current Every Day Smoker    Packs/day: 0.50    Types: Cigarettes  . Smokeless tobacco: Current User  . Alcohol use 7.2 oz/week    12 Cans of beer per week     Allergies   Tylenol [acetaminophen]   Review of Systems Review of Systems  Constitutional: Positive for fever (Subjective).  HENT: Negative for congestion.   Eyes: Negative for visual disturbance.  Respiratory: Negative for cough and shortness of breath.   Cardiovascular: Negative.   Gastrointestinal: Positive for abdominal pain and diarrhea. Negative for blood in stool, constipation, nausea and vomiting.  Genitourinary: Negative for dysuria.  Musculoskeletal: Negative for back pain and neck pain.  Skin: Negative for color change.  Neurological: Negative for headaches.     Physical Exam Updated Vital Signs BP 114/72   Pulse 73   Temp 98.1 F (36.7 C) (Oral)   Resp 16   Ht 6\' 7"  (2.007 m)   Wt 114.3 kg   SpO2 97%   BMI 28.39 kg/m   Physical Exam  Constitutional: He is oriented to person, place, and time. He appears well-developed and well-nourished. No distress.  HENT:  Head: Normocephalic and atraumatic.  Cardiovascular: Normal rate, regular rhythm and normal heart sounds.  No murmur heard. Pulmonary/Chest: Effort normal and breath sounds normal. No respiratory distress.  Abdominal: Soft. He exhibits no distension. There is tenderness.  Generalized abdominal tenderness most significantly at RUQ and epigastrium.   Musculoskeletal: He exhibits no edema.  Neurological: He is alert and oriented to person, place, and time.  Skin: Skin is warm and dry.  Nursing note and vitals reviewed.    ED Treatments /  Results  Labs (all labs ordered are listed, but only abnormal results are displayed) Labs Reviewed  COMPREHENSIVE METABOLIC PANEL - Abnormal; Notable for the following:       Result Value   Sodium 133 (*)    Potassium 3.4 (*)    Chloride 96 (*)    Creatinine, Ser 1.32 (*)    ALT 10 (*)    All other components within normal limits  CBC WITH DIFFERENTIAL/PLATELET - Abnormal; Notable for the following:    WBC 3.8 (*)    All other components within normal limits  URINALYSIS, ROUTINE W REFLEX MICROSCOPIC - Abnormal; Notable for the following:    Color, Urine AMBER (*)    APPearance HAZY (*)    Specific Gravity, Urine 1.032 (*)    Hgb urine dipstick SMALL (*)    Ketones, ur 80 (*)    Protein, ur 100 (*)    Bacteria, UA RARE (*)    Squamous Epithelial / LPF 0-5 (*)    All other components within normal limits  LIPASE, BLOOD    EKG  EKG Interpretation None       Radiology [place] Abdomen Limited Ruq  Result Date: 08/22/2016 CLINICAL DATA:  Abdominal pain associated with fever, nausea and vomiting, and diarrhea. EXAM: US ABDOMEN LIMITED - RIGHT UPPER QUADRANT COMPARISON:  None in PACs FINDINGS: Gallbladder: No gallstones or wall thickening visualized. No sonographic Murphy sign noted by sonographer. Common bile duct: Diameter: 3.9 mm Liver: No focal lesion identified. Within normal limits in parenchymal echogenicity. IMPRESSION: Normal limited right upper quadrant abdominal ultrasound. No gallstones or sonographic evidence of acute cholecystitis. If there are clinical concerns of gallbladder dysfunction, a nuclear medicine hepatobiliary scan may be useful. Electronically Signed   By: David  [place]aziland[place] M.D.   On: 08/22/2016 07:24    Procedures Procedures (including critical care time)  Medications Ordered in ED Medications  dicyclomine (BENTYL) injection 20 mg (20 mg Intramuscular Refused 08/22/16 0802)  gi cocktail (Maalox,Lidocaine,Donnatal) (30 mLs Oral Given 08/22/16 16100623)  morphine  4 MG/ML injection 4 mg (4 mg Intravenous Given 08/22/16 0629)  sodium chloride 0.9 % bolus 1,000 mL (0 mLs Intravenous Stopped 08/22/16 0904)  ketorolac (TORADOL) 30 MG/ML injection 30 mg (30 mg Intravenous Given 08/22/16 0802)  sodium chloride 0.9 % bolus 500 mL (500 mLs Intravenous New Bag/Given 08/22/16 0920)     Initial Impression / Assessment and Plan / ED Course  I have reviewed the triage vital signs and the nursing notes.  Pertinent labs & imaging results that were available during my care of the patient were reviewed by me and considered in my medical decision making (see chart for details).  Clinical Course    Phillip Oliver is a 50 y.o. male who presents to ED for generalized abdominal pain and diarrhea x 3 days. Symptoms seem most c/w viral etiology however given extensive tenderness to epigastrium/RUQ, ultrasounds was obtained which was reassuring. Patient's symptoms improved with pain meds and GI cocktail. Elevated creatinine likely 2/2 dehydration. UA with no signs of infection.   Patient is nontoxic, nonseptic appearing,  in no apparent distress. Patient's pain and other symptoms adequately managed in emergency department. 1500cc fluid bolus given. Patient does not meet the SIRS or Sepsis criteria. On repeat exam patient does not have a surgical abdomin and there are no peritoneal signs. No indication of appendicitis, bowel obstruction, bowel perforation, cholecystitis, diverticulitis. Patient discharged home with symptomatic treatment and given strict instructions for follow-up with their primary care physician. I have also discussed reasons to return immediately to the ER. Patient expresses understanding and agrees with plan.   Final Clinical Impressions(s) / ED Diagnoses   Final diagnoses:  Abdominal pain    New Prescriptions New Prescriptions   No medications on file     [facility] EndoscopyJaime Pilcher Ward, PA-C 08/22/16 [id number]    Gilda Creasehristopher J Pollina, MD 08/23/16 [phone]

## 2016-09-26 ENCOUNTER — Ambulatory Visit: Payer: Self-pay | Admitting: Family Medicine

## 2016-10-01 ENCOUNTER — Ambulatory Visit: Payer: Self-pay | Attending: Family Medicine | Admitting: Family Medicine

## 2016-10-01 ENCOUNTER — Encounter: Payer: Self-pay | Admitting: Family Medicine

## 2016-10-01 DIAGNOSIS — M170 Bilateral primary osteoarthritis of knee: Secondary | ICD-10-CM | POA: Insufficient documentation

## 2016-10-01 DIAGNOSIS — E784 Other hyperlipidemia: Secondary | ICD-10-CM

## 2016-10-01 DIAGNOSIS — I10 Essential (primary) hypertension: Secondary | ICD-10-CM | POA: Insufficient documentation

## 2016-10-01 DIAGNOSIS — E7849 Other hyperlipidemia: Secondary | ICD-10-CM

## 2016-10-01 DIAGNOSIS — E785 Hyperlipidemia, unspecified: Secondary | ICD-10-CM | POA: Insufficient documentation

## 2016-10-01 DIAGNOSIS — N529 Male erectile dysfunction, unspecified: Secondary | ICD-10-CM | POA: Insufficient documentation

## 2016-10-01 LAB — BASIC METABOLIC PANEL
BUN: 15 mg/dL (ref 7–25)
CALCIUM: 8.9 mg/dL (ref 8.6–10.3)
CO2: 26 mmol/L (ref 20–31)
CREATININE: 1.03 mg/dL (ref 0.70–1.33)
Chloride: 106 mmol/L (ref 98–110)
GLUCOSE: 84 mg/dL (ref 65–99)
Potassium: 4.5 mmol/L (ref 3.5–5.3)
Sodium: 139 mmol/L (ref 135–146)

## 2016-10-01 MED ORDER — TRAMADOL HCL 50 MG PO TABS: 50.0000 mg | ORAL_TABLET | Freq: Two times a day (BID) | ORAL | 2 refills | Status: DC

## 2016-10-01 MED ORDER — SILDENAFIL CITRATE 100 MG PO TABS: 50.0000 mg | ORAL_TABLET | Freq: Every day | ORAL | 0 refills | Status: DC | PRN

## 2016-10-01 MED ORDER — ATORVASTATIN CALCIUM 20 MG PO TABS: 20.0000 mg | ORAL_TABLET | Freq: Every day | ORAL | 5 refills | Status: DC

## 2016-10-01 MED ORDER — PREDNISONE 20 MG PO TABS: 20.0000 mg | ORAL_TABLET | Freq: Two times a day (BID) | ORAL | 0 refills | Status: DC

## 2016-10-01 MED ORDER — LISINOPRIL-HYDROCHLOROTHIAZIDE 20-25 MG PO TABS: 1.0000 | ORAL_TABLET | Freq: Every day | ORAL | 5 refills | Status: DC

## 2016-10-01 MED ORDER — NAPROXEN 500 MG PO TABS: ORAL_TABLET | ORAL | 3 refills | Status: DC

## 2016-10-01 MED FILL — LISINOPRIL-HCTZ 20-25 MG TA: 20-25 | 30 days supply | Qty: 30 | Fill #1

## 2016-10-01 MED FILL — predniSONE 20 MG TABS: 20 | 5 days supply | Qty: 10 | Fill #0

## 2016-10-01 MED FILL — traMADol HCL 50 MG TABS: 50 | 30 days supply | Qty: 60 | Fill #0

## 2016-10-01 MED FILL — ATORVASTATIN 20 MG TABLET: 20 | 30 days supply | Qty: 30 | Fill #1

## 2016-10-01 MED FILL — NAPROXEN 500 MG TABLET: 500 | 30 days supply | Qty: 60 | Fill #1

## 2016-10-01 NOTE — Progress Notes (Signed)
Subjective:  Patient ID: Phillip Oliver, male    DOB: 04/30/1966  Age: 51 y.o. MRN: [id number]  CC: Hypertension; Hyperlipidemia; and testosterone (feels level are low)   HPI Phillip Oliver  is a 51 year old male with a history of hypertension, hyperlipidemia, bilateral knee osteoarthritis who comes into the clinic for a follow-up visit.  He has been compliant with his antihypertensive and his statin and is adhering to a low-cholesterol, low-sodium diet. He complains of severe bilateral knee pain which is not controlled on naproxen relieved when he take tramadol; he uses bilateral knee braces but pain is worse with going up and down stairs. Knee x-rays from 06/2016 revealed tricompartmental osteoarthritis.  Past Medical History:  Diagnosis Date  . Hypertension     History reviewed. No pertinent surgical history.  Allergies  Allergen Reactions  . Tylenol [Acetaminophen] Other (See Comments)    Patient states that his mother told him this "years ago" as a child     Outpatient Medications Prior to Visit  Medication Sig Dispense Refill  . naproxen (NAPROSYN) 500 MG tablet Take 1 tablet by mouth 2 times daily with a meal.. 60 tablet 3  . ondansetron (ZOFRAN ODT) 4 MG disintegrating tablet Take 1 tablet (4 mg total) by mouth every 8 (eight) hours as needed for nausea or vomiting. (Patient not taking: Reported on 10/01/2016) 10 tablet 0  . atorvastatin (LIPITOR) 20 MG tablet Take 1 tablet (20 mg total) by mouth daily. (Patient not taking: Reported on 10/01/2016) 30 tablet 5  . dicyclomine (BENTYL) 20 MG tablet Take 1 tablet (20 mg total) by mouth 2 (two) times daily as needed for spasms (abdominal cramps). (Patient not taking: Reported on 10/01/2016) 20 tablet 0  . lisinopril-hydrochlorothiazide (PRINZIDE,ZESTORETIC) 20-25 MG tablet Take 1 tablet by mouth daily. (Patient not taking: Reported on 10/01/2016) 30 tablet 5  . sildenafil (VIAGRA) 100 MG tablet Take 0.5-1 tablets (50-100 mg total)  by mouth daily as needed for erectile dysfunction. (Patient not taking: Reported on 10/01/2016) 5 tablet 0  . traMADol (ULTRAM) 50 MG tablet TAKE 1 TABLET BY MOUTH EVERY 12 HOURS (Patient not taking: Reported on 08/22/2016) 60 tablet 0   No facility-administered medications prior to visit.     ROS Review of Systems Constitutional: Negative for activity change and appetite change.  HENT: Negative for sinus pressure and sore throat.   Eyes: Negative for visual disturbance.  Respiratory: Negative for cough, chest tightness and shortness of breath.   Cardiovascular: Negative for chest pain and leg swelling.  Gastrointestinal: Negative for abdominal distention, abdominal pain, constipation and diarrhea.  Endocrine: Negative.   Genitourinary: Negative for dysuria.  Musculoskeletal: Negative for joint swelling and myalgias.       See hpi  Skin: Negative for rash.  Allergic/Immunologic: Negative.   Neurological: Negative for weakness, light-headedness and numbness.  Psychiatric/Behavioral: Negative for dysphoric mood and suicidal ideas.  Objective:  BP 138/89 (BP Location: Right Arm, Patient Position: Sitting, Cuff Size: Large)   Pulse (!) 53   Temp 98.3 F (36.8 C) (Oral)   Ht 6\' 7"  (2.007 m)   Wt 248 lb 3.2 oz (112.6 kg)   SpO2 100%   BMI 27.96 kg/m   BP/Weight 10/01/2016 08/22/2016 06/11/2016  Systolic BP 138 114 118  Diastolic BP 89 72 77  Wt. (Lbs) 248.2 252 252.8  BMI 27.96 28.39 28.48      Physical Exam Constitutional: Patient appears well-developed and well-nourished. No distress. HENT: Normocephalic, atraumatic, External right and left ear normal.  Oropharynx is clear and moist.  Eyes: Conjunctivae and EOM are normal. PERRLA, no scleral icterus. Neck: Normal ROM. Neck supple. No JVD. No tracheal deviation. No thyromegaly. CVS: Bradycardic rate, regular rate, S1/S2 +, no murmurs, no gallops, no carotid bruit.  Pulmonary: Effort and breath sounds normal, no stridor,  rhonchi, wheezes, rales.  Abdominal: Soft. BS +,  no distension, tenderness, rebound or guarding.  Musculoskeletal: Crepitus on range of motion of both knees with associated tenderness Lymphadenopathy: No lymphadenopathy noted, cervical, inguinal or axillary Neuro: Alert. Normal reflexes, muscle tone coordination. No cranial nerve deficit. Skin: Skin is warm and dry. No rash noted. Not diaphoretic. No erythema. No pallor. Psychiatric: Normal mood and affect. Behavior, judgment, thought content normal.  Lipid Panel     Component Value Date/Time   CHOL 218 (H) 02/22/2016 0959   TRIG 126 02/22/2016 0959   HDL 63 02/22/2016 0959   CHOLHDL 3.5 02/22/2016 0959   VLDL 25 02/22/2016 0959   LDLCALC 130 (H) 02/22/2016 0959    CMP Latest Ref Rng & Units 08/22/2016 01/07/2016 12/23/2015  Glucose 65 - 99 mg/dL 92 92 166(A)  BUN 6 - 20 mg/dL 16 10 7   Creatinine 0.61 - 1.24 mg/dL 6.30(Z) 6.01 0.93  Sodium 135 - 145 mmol/L 133(L) 142 138  Potassium 3.5 - 5.1 mmol/L 3.4(L) 4.2 3.8  Chloride 101 - 111 mmol/L 96(L) 108 104  CO2 22 - 32 mmol/L 26 25 24   Calcium 8.9 - 10.3 mg/dL 9.0 8.9 2.3(F)  Total Protein 6.5 - 8.1 g/dL 7.6 - -  Total Bilirubin 0.3 - 1.2 mg/dL 0.9 - -  Alkaline Phos 38 - 126 U/L 51 - -  AST 15 - 41 U/L 20 - -  ALT 17 - 63 U/L 10(L) - -     Assessment & Plan:   1. Other hyperlipidemia Uncontrolled Lipitor was added at last visit Continue low-cholesterol diet and repeat lipids at next visit - atorvastatin (LIPITOR) 20 MG tablet; Take 1 tablet (20 mg total) by mouth daily.  Dispense: 30 tablet; Refill: 5  2. Essential hypertension Controlled Low-sodium diet - lisinopril-hydrochlorothiazide (PRINZIDE,ZESTORETIC) 20-25 MG tablet; Take 1 tablet by mouth daily.  Dispense: 30 tablet; Refill: 5 - Basic Metabolic Panel  3. Primary osteoarthritis of both knees Uncontrolled We have discussed other management options including intra-articular shot and possible also referral He  would like to continue on medications for now; continue knee brace - naproxen (NAPROSYN) 500 MG tablet; Take 1 tablet by mouth 2 times daily with a meal..  Dispense: 60 tablet; Refill: 3 - traMADol (ULTRAM) 50 MG tablet; Take 1 tablet (50 mg total) by mouth every 12 (twelve) hours.  Dispense: 60 tablet; Refill: 2 - predniSONE (DELTASONE) 20 MG tablet; Take 1 tablet (20 mg total) by mouth 2 (two) times daily with a meal.  Dispense: 10 tablet; Refill: 0  4. Erectile dysfunction, unspecified erectile dysfunction type - sildenafil (VIAGRA) 100 MG tablet; Take 0.5-1 tablets (50-100 mg total) by mouth daily as needed for erectile dysfunction.  Dispense: 5 tablet; Refill: 0 - Testosterone Total,Free,Bio, Males   Meds ordered this encounter  Medications  . atorvastatin (LIPITOR) 20 MG tablet    Sig: Take 1 tablet (20 mg total) by mouth daily.    Dispense:  30 tablet    Refill:  5  . lisinopril-hydrochlorothiazide (PRINZIDE,ZESTORETIC) 20-25 MG tablet    Sig: Take 1 tablet by mouth daily.    Dispense:  30 tablet    Refill:  5  .  naproxen (NAPROSYN) 500 MG tablet    Sig: Take 1 tablet by mouth 2 times daily with a meal..    Dispense:  60 tablet    Refill:  3  . traMADol (ULTRAM) 50 MG tablet    Sig: Take 1 tablet (50 mg total) by mouth every 12 (twelve) hours.    Dispense:  60 tablet    Refill:  2  . sildenafil (VIAGRA) 100 MG tablet    Sig: Take 0.5-1 tablets (50-100 mg total) by mouth daily as needed for erectile dysfunction.    Dispense:  5 tablet    Refill:  0  . predniSONE (DELTASONE) 20 MG tablet    Sig: Take 1 tablet (20 mg total) by mouth 2 (two) times daily with a meal.    Dispense:  10 tablet    Refill:  0    Follow-up: Return in about 3 months (around 12/30/2016) for Follow-up on chronic medical conditions.   Jaclyn Shaggy MD

## 2016-10-01 NOTE — Progress Notes (Signed)
Needs refills- naproxen, lipitor, tramadol

## 2016-10-02 LAB — TESTOSTERONE TOTAL,FREE,BIO, MALES
Albumin: 3.7 g/dL (ref 3.6–5.1)
Sex Hormone Binding: 50 nmol/L (ref 10–50)
TESTOSTERONE: 489 ng/dL (ref 250–827)
Testosterone, Bioavailable: 81.2 ng/dL — ABNORMAL LOW (ref 110.0–575.0)
Testosterone, Free: 47.5 pg/mL (ref 46.0–224.0)

## 2016-10-10 ENCOUNTER — Telehealth: Payer: Self-pay

## 2016-10-10 NOTE — Telephone Encounter (Signed)
-----   Message from Jaclyn ShaggyEnobong Amao, MD sent at 10/08/2016  2:13 PM EST ----- Total and free testosterone are wnl

## 2016-10-10 NOTE — Telephone Encounter (Signed)
Writer called patient with lab results. Patient stated understanding. 

## 2016-11-11 ENCOUNTER — Other Ambulatory Visit: Payer: Self-pay | Admitting: *Deleted

## 2016-11-11 DIAGNOSIS — N529 Male erectile dysfunction, unspecified: Secondary | ICD-10-CM

## 2016-11-11 MED ORDER — SILDENAFIL CITRATE 100 MG PO TABS: 50.0000 mg | ORAL_TABLET | Freq: Every day | ORAL | 3 refills | Status: DC | PRN

## 2016-11-11 NOTE — Telephone Encounter (Signed)
PRINTED FOR PASS PROGRAM 

## 2016-11-12 ENCOUNTER — Telehealth: Payer: Self-pay | Admitting: Family Medicine

## 2016-11-12 NOTE — Telephone Encounter (Signed)
Pt called to report elevated BP reading from the plasma center. Report BP readings January 31,2018 201/95  November 10, 2016 : 189/103 and  March 6th :189/112.  Pt states he needs a note before he can donate plasma again but was told by plasma center to f/u with PCP for BP management.  Pt denies chest pain, SOB, headaches, blurred vision. He states he feels healthy. Apt. Scheduled with provider on Thursday March 8th, 2018

## 2016-11-12 NOTE — Telephone Encounter (Signed)
Patient called to speak with nurse regarding his bp. Pt has tried to donate plasma and can't because his bp is elevated. Please follow up.  Thank you.

## 2016-11-13 ENCOUNTER — Encounter: Payer: Self-pay | Admitting: Physician Assistant

## 2016-11-13 ENCOUNTER — Ambulatory Visit: Payer: Self-pay | Attending: Family Medicine | Admitting: Physician Assistant

## 2016-11-13 VITALS — BP 194/113 | HR 91 | Temp 97.6°F | Wt 283.2 lb

## 2016-11-13 DIAGNOSIS — I10 Essential (primary) hypertension: Secondary | ICD-10-CM | POA: Insufficient documentation

## 2016-11-13 DIAGNOSIS — F17210 Nicotine dependence, cigarettes, uncomplicated: Secondary | ICD-10-CM | POA: Insufficient documentation

## 2016-11-13 DIAGNOSIS — Z79899 Other long term (current) drug therapy: Secondary | ICD-10-CM | POA: Insufficient documentation

## 2016-11-13 MED ORDER — LISINOPRIL 40 MG PO TABS
40.0000 mg | ORAL_TABLET | Freq: Every day | ORAL | 1 refills | Status: DC
Start: 2016-11-13 — End: 2017-05-27

## 2016-11-13 MED ORDER — HYDROCHLOROTHIAZIDE 25 MG PO TABS: 25.0000 mg | ORAL_TABLET | Freq: Every day | ORAL | 1 refills | Status: DC

## 2016-11-13 MED FILL — ?LISINOPRIL 40 MG TABLET: 40 MG | 30 days supply | Qty: 30 | Fill #0

## 2016-11-13 MED FILL — HYDROCHLOROTHIAZIDE 25 MG T: 25 | 30 days supply | Qty: 30 | Fill #0

## 2016-11-13 NOTE — Patient Instructions (Addendum)
Check blood pressure outside of the office daily and record and bring to next visit.   Hypertension Hypertension is another name for high blood pressure. High blood pressure forces your heart to work harder to pump blood. This can cause problems over time. There are two numbers in a blood pressure reading. There is a top number (systolic) over a bottom number (diastolic). It is best to have a blood pressure below 120/80. Healthy choices can help lower your blood pressure. You may need medicine to help lower your blood pressure if:  Your blood pressure cannot be lowered with healthy choices.  Your blood pressure is higher than 130/80. Follow these instructions at home: Eating and drinking   If directed, follow the DASH eating plan. This diet includes:  Filling half of your plate at each meal with fruits and vegetables.  Filling one quarter of your plate at each meal with whole grains. Whole grains include whole wheat pasta, brown rice, and whole grain bread.  Eating or drinking low-fat dairy products, such as skim milk or low-fat yogurt.  Filling one quarter of your plate at each meal with low-fat (lean) proteins. Low-fat proteins include fish, skinless chicken, eggs, beans, and tofu.  Avoiding fatty meat, cured and processed meat, or chicken with skin.  Avoiding premade or processed food.  Eat less than 1,500 mg of salt (sodium) a day.  Limit alcohol use to no more than 1 drink a day for nonpregnant women and 2 drinks a day for men. One drink equals 12 oz of beer, 5 oz of wine, or 1 oz of hard liquor. Lifestyle   Work with your doctor to stay at a healthy weight or to lose weight. Ask your doctor what the best weight is for you.  Get at least 30 minutes of exercise that causes your heart to beat faster (aerobic exercise) most days of the week. This may include walking, swimming, or biking.  Get at least 30 minutes of exercise that strengthens your muscles (resistance exercise) at  least 3 days a week. This may include lifting weights or pilates.  Do not use any products that contain nicotine or tobacco. This includes cigarettes and e-cigarettes. If you need help quitting, ask your doctor.  Check your blood pressure at home as told by your doctor.  Keep all follow-up visits as told by your doctor. This is important. Medicines   Take over-the-counter and prescription medicines only as told by your doctor. Follow directions carefully.  Do not skip doses of blood pressure medicine. The medicine does not work as well if you skip doses. Skipping doses also puts you at risk for problems.  Ask your doctor about side effects or reactions to medicines that you should watch for. Contact a doctor if:  You think you are having a reaction to the medicine you are taking.  You have headaches that keep coming back (recurring).  You feel dizzy.  You have swelling in your ankles.  You have trouble with your vision. Get help right away if:  You get a very bad headache.  You start to feel confused.  You feel weak or numb.  You feel faint.  You get very bad pain in your:  Chest.  Belly (abdomen).  You throw up (vomit) more than once.  You have trouble breathing. Summary  Hypertension is another name for high blood pressure.  Making healthy choices can help lower blood pressure. If your blood pressure cannot be controlled with healthy choices, you may  need to take medicine. This information is not intended to replace advice given to you by your health care provider. Make sure you discuss any questions you have with your health care provider. Document Released: 02/11/2008 Document Revised: 07/23/2016 Document Reviewed: 07/23/2016 Elsevier Interactive Patient Education  2017 [employer]

## 2016-11-13 NOTE — Progress Notes (Signed)
Phillip Riegerimothy Zacher, is a 51 y.o. male  [id number]  [id number]  DOB - 01/26/1966  Subjective:  Chief Complaint and HPI: Phillip Oliver is a 51 y.o. male here today for uncontrolled htn.  He usu donates plasma and the last few times he has tried to donate, they wouldn't let him donate because his BP was too high.  He denies dizziness, CP, SOB, blurry vision, HAs.  He admits to smoking ~10 cigarettes a day.  He drinks about 4 drinks of alcohol/day.  Sometimes more.  Denies other substances.  No complaints-he just wants to be able to donate plasma.  ROS:   Constitutional:  No f/c, No night sweats, No unexplained weight loss. EENT:  No vision changes, No blurry vision, No hearing changes. No mouth, throat, or ear problems.  Respiratory: No cough, No SOB Cardiac: No CP, no palpitations GI:  No abd pain, No N/V/D. GU: No Urinary s/sx Musculoskeletal: No joint pain Neuro: No headache, no dizziness, no motor weakness.  Skin: No rash Endocrine:  No polydipsia. No polyuria.  Psych: Denies SI/HI  No problems updated.  ALLERGIES: Allergies  Allergen Reactions  . Tylenol [Acetaminophen] Other (See Comments)    Patient states that his mother told him this "years ago" as a child    PAST MEDICAL HISTORY: Past Medical History:  Diagnosis Date  . Hypertension     MEDICATIONS AT HOME: Prior to Admission medications   Medication Sig Start Date End Date Taking? Authorizing Provider  atorvastatin (LIPITOR) 20 MG tablet Take 1 tablet (20 mg total) by mouth daily. 10/01/16   Jaclyn ShaggyEnobong Amao, MD  hydrochlorothiazide (HYDRODIURIL) 25 MG tablet Take 1 tablet (25 mg total) by mouth daily. 11/13/16   Anders SimmondsAngela M McClung, PA-C  lisinopril (PRINIVIL,ZESTRIL) 40 MG tablet Take 1 tablet (40 mg total) by mouth daily. 11/13/16   Anders SimmondsAngela M McClung, PA-C  naproxen (NAPROSYN) 500 MG tablet Take 1 tablet by mouth 2 times daily with a meal.. 10/01/16   Jaclyn ShaggyEnobong Amao, MD  ondansetron (ZOFRAN ODT) 4 MG disintegrating  tablet Take 1 tablet (4 mg total) by mouth every 8 (eight) hours as needed for nausea or vomiting. Patient not taking: Reported on 10/01/2016 08/22/16   [facility] Center LLCJaime Pilcher Ward, PA-C  predniSONE (DELTASONE) 20 MG tablet Take 1 tablet (20 mg total) by mouth 2 (two) times daily with a meal. 10/01/16   Jaclyn ShaggyEnobong Amao, MD  sildenafil (VIAGRA) 100 MG tablet Take 0.5-1 tablets (50-100 mg total) by mouth daily as needed for erectile dysfunction. 11/11/16   Jaclyn ShaggyEnobong Amao, MD  traMADol (ULTRAM) 50 MG tablet Take 1 tablet (50 mg total) by mouth every 12 (twelve) hours. 10/01/16   Jaclyn ShaggyEnobong Amao, MD     Objective:  EXAM:   Vitals:   11/13/16 1621  BP: (!) 194/113  Pulse: 91  Temp: 97.6 F (36.4 C)  TempSrc: Oral  SpO2: 98%  Weight: 283 lb 3.2 oz (128.5 kg)    General appearance : A&OX3. NAD. Non-toxic-appearing HEENT: Atraumatic and Normocephalic.  PERRLA. EOM intact.  Fundi benign. Neck: supple, no JVD. No cervical lymphadenopathy. No thyromegaly.  No carotid bruit Chest/Lungs:  Breathing-non-labored, Good air entry bilaterally, breath sounds normal without rales, rhonchi, or wheezing  CVS: S1 S2 regular, no murmurs, gallops, rubs  Extremities: Bilateral Lower Ext shows no edema, both legs are warm to touch with = pulse throughout Neurology:  CN II-XII grossly intact, Non focal.   Psych:  TP linear. J/I WNL. Normal speech. Appropriate eye contact and affect.  Skin:  No Rash  Data Review No results found for: HGBA1C   Assessment & Plan   1. Hypertension, unspecified type Change dose from Lisinopril/HCT 20/25- - lisinopril (PRINIVIL,ZESTRIL) 40 MG tablet; Take 1 tablet (40 mg total) by mouth daily.  Dispense: 90 tablet; Refill: 1 - hydrochlorothiazide (HYDRODIURIL) 25 MG tablet; Take 1 tablet (25 mg total) by mouth daily.  Dispense: 90 tablet; Refill: 1 Smoking cessation encouraged.  Alcohol cessation also encouraged.  Reviewed recent labs. Not suitable for plasma donation at this time.  Patient  have been counseled extensively about nutrition and exercise  Return in about 2 weeks (around 11/27/2016) for Dr. Venetia Night; BP f/up and consider plasma donation forms.  The patient was given clear instructions to go to ER or return to medical center if symptoms don't improve, worsen or new problems develop. The patient verbalized understanding. The patient was told to call to get lab results if they haven't heard anything in the next week.     Phillip Co, PA-C [facility] [place], [place] [phone]   11/13/2016, 4:43 PMPatient ID: Phillip Oliver, male   DOB: [birth year], 51 y.o.   MRN: [id number]

## 2016-11-14 NOTE — Telephone Encounter (Signed)
Will see him at his appointment.

## 2016-11-18 ENCOUNTER — Other Ambulatory Visit: Payer: Self-pay | Admitting: Family Medicine

## 2016-11-18 DIAGNOSIS — N529 Male erectile dysfunction, unspecified: Secondary | ICD-10-CM

## 2016-11-18 MED ORDER — SILDENAFIL CITRATE 100 MG PO TABS: 50.0000 mg | ORAL_TABLET | Freq: Every day | ORAL | 3 refills | Status: DC | PRN

## 2016-11-27 ENCOUNTER — Ambulatory Visit: Payer: Self-pay | Attending: Family Medicine | Admitting: Family Medicine

## 2016-11-27 ENCOUNTER — Encounter: Payer: Self-pay | Admitting: Family Medicine

## 2016-11-27 VITALS — BP 161/110 | HR 66 | Temp 97.6°F | Ht 78.75 in | Wt 252.2 lb

## 2016-11-27 DIAGNOSIS — I10 Essential (primary) hypertension: Secondary | ICD-10-CM | POA: Insufficient documentation

## 2016-11-27 MED ORDER — AMLODIPINE BESYLATE 10 MG PO TABS: 10.0000 mg | ORAL_TABLET | Freq: Every day | ORAL | 5 refills | Status: DC

## 2016-11-27 MED FILL — AMLODIPINE BESYLATE 10 MG T: 10 | 30 days supply | Qty: 30 | Fill #0

## 2016-11-27 MED FILL — ATORVASTATIN 20 MG TABLET: 20 | 30 days supply | Qty: 30 | Fill #0

## 2016-11-27 NOTE — Patient Instructions (Signed)

## 2016-11-27 NOTE — Progress Notes (Signed)
Subjective:  Patient ID: Phillip Oliver, male    DOB: 06/20/1966  Age: 51 y.o. MRN: [id number]  CC: Hypertension   HPI Phillip Oliver is a 51 year old male who presents today for follow-up on hypertension. He has been donating plasma and blood pressures have been in the 180-190 systolics;  He brings in his blood pressure log which reveals blood pressures have 150-170 systolic despite compliance with lisinopril 40 mg and hydrochlorothiazide 25 mg. He has been stressed due to and peripheral job hunting as per his wife. He has no other complaints today.  Past Medical History:  Diagnosis Date  . Hypertension     History reviewed. No pertinent surgical history.  Allergies  Allergen Reactions  . Tylenol [Acetaminophen] Other (See Comments)    Patient states that his mother told him this "years ago" as a child      Outpatient Medications Prior to Visit  Medication Sig Dispense Refill  . atorvastatin (LIPITOR) 20 MG tablet Take 1 tablet (20 mg total) by mouth daily. 30 tablet 5  . hydrochlorothiazide (HYDRODIURIL) 25 MG tablet Take 1 tablet (25 mg total) by mouth daily. 90 tablet 1  . lisinopril (PRINIVIL,ZESTRIL) 40 MG tablet Take 1 tablet (40 mg total) by mouth daily. 90 tablet 1  . naproxen (NAPROSYN) 500 MG tablet Take 1 tablet by mouth 2 times daily with a meal.. (Patient not taking: Reported on 11/27/2016) 60 tablet 3  . ondansetron (ZOFRAN ODT) 4 MG disintegrating tablet Take 1 tablet (4 mg total) by mouth every 8 (eight) hours as needed for nausea or vomiting. (Patient not taking: Reported on 10/01/2016) 10 tablet 0  . sildenafil (VIAGRA) 100 MG tablet Take 0.5-1 tablets (50-100 mg total) by mouth daily as needed for erectile dysfunction. At least 24 hours between doses (Patient not taking: Reported on 11/27/2016) 30 tablet 3  . traMADol (ULTRAM) 50 MG tablet Take 1 tablet (50 mg total) by mouth every 12 (twelve) hours. (Patient not taking: Reported on 11/27/2016) 60 tablet 2  .  predniSONE (DELTASONE) 20 MG tablet Take 1 tablet (20 mg total) by mouth 2 (two) times daily with a meal. 10 tablet 0   No facility-administered medications prior to visit.     ROS Review of Systems  Constitutional: Negative for activity change and appetite change.  HENT: Negative for sinus pressure and sore throat.   Respiratory: Negative for chest tightness, shortness of breath and wheezing.   Cardiovascular: Negative for chest pain and palpitations.  Gastrointestinal: Negative for abdominal distention, abdominal pain and constipation.  Genitourinary: Negative.   Musculoskeletal: Negative.   Psychiatric/Behavioral: Negative for behavioral problems and dysphoric mood.    Objective:  BP (!) 161/110 (BP Location: Right Arm, Patient Position: Sitting, Cuff Size: Small)   Pulse 66   Temp 97.6 F (36.4 C) (Oral)   Ht 6' 6.75" (2 m)   Wt 252 lb 3.2 oz (114.4 kg)   SpO2 100%   BMI 28.59 kg/m   BP/Weight 11/27/2016 11/13/2016 10/01/2016  Systolic BP 161 194 138  Diastolic BP 110 113 89  Wt. (Lbs) 252.2 283.2 248.2  BMI 28.59 31.9 27.96      Physical Exam  Constitutional: He is oriented to person, place, and time. He appears well-developed and well-nourished.  Cardiovascular: Normal rate, normal heart sounds and intact distal pulses.   No murmur heard. Pulmonary/Chest: Effort normal and breath sounds normal. He has no wheezes. He has no rales. He exhibits no tenderness.  Abdominal: Soft. Bowel sounds are normal.  He exhibits no distension and no mass. There is no tenderness.  Musculoskeletal: Normal range of motion.  Neurological: He is alert and oriented to person, place, and time.  Skin: Skin is warm and dry.  Psychiatric: He has a normal mood and affect.     Assessment & Plan:   1. Essential hypertension Continue Lisinopril and hydrochlorothiazide Amlodipine added to regimen Low-sodium, DASH diet Will complete form for plasma donation at next visit his blood pressure is  controlled.   Meds ordered this encounter  Medications  . amLODipine (NORVASC) 10 MG tablet    Sig: Take 1 tablet (10 mg total) by mouth daily.    Dispense:  30 tablet    Refill:  5    Follow-up: Return in about 6 days (around 12/03/2016) for Follow-up of hypertension.   Jaclyn Shaggy MD

## 2016-12-03 ENCOUNTER — Ambulatory Visit: Payer: Self-pay | Attending: Family Medicine | Admitting: Family Medicine

## 2016-12-03 ENCOUNTER — Encounter: Payer: Self-pay | Admitting: Family Medicine

## 2016-12-03 VITALS — BP 149/94 | HR 81 | Temp 97.8°F | Ht 79.0 in | Wt 254.6 lb

## 2016-12-03 DIAGNOSIS — Z79899 Other long term (current) drug therapy: Secondary | ICD-10-CM | POA: Insufficient documentation

## 2016-12-03 DIAGNOSIS — I10 Essential (primary) hypertension: Secondary | ICD-10-CM

## 2016-12-03 DIAGNOSIS — E7800 Pure hypercholesterolemia, unspecified: Secondary | ICD-10-CM

## 2016-12-03 DIAGNOSIS — E785 Hyperlipidemia, unspecified: Secondary | ICD-10-CM | POA: Insufficient documentation

## 2016-12-03 DIAGNOSIS — M170 Bilateral primary osteoarthritis of knee: Secondary | ICD-10-CM

## 2016-12-03 MED ORDER — TRAMADOL HCL 50 MG PO TABS: 50.0000 mg | ORAL_TABLET | Freq: Two times a day (BID) | ORAL | 1 refills | Status: DC

## 2016-12-03 NOTE — Progress Notes (Signed)
Needs tramadol

## 2016-12-03 NOTE — Progress Notes (Signed)
Subjective:  Patient ID: Phillip Oliver, male    DOB: 26-Jun-1966  Age: 51 y.o. MRN: [id number]  CC: Hypertension and Hyperlipidemia   HPI Phillip Oliver male with a history of hyperlipidemia, hypertension, chronic knee pain from bilateral knee osteoarthritis who presents today for follow-up on his blood pressure. Amlodipine was added to his regimen at his last office visit with resulting improvement in his blood pressure. He was previously on naproxen for chronic knee pain but this caused elevation of his blood pressure.   He would like his form completed to allow him donate plasma as this has been on hold due to uncontrolled blood pressure.  Past Medical History:  Diagnosis Date  . Hypertension     History reviewed. No pertinent surgical history.  Allergies  Allergen Reactions  . Tylenol [Acetaminophen] Other (See Comments)    Patient states that his mother told him this "years ago" as a child     Outpatient Medications Prior to Visit  Medication Sig Dispense Refill  . amLODipine (NORVASC) 10 MG tablet Take 1 tablet (10 mg total) by mouth daily. 30 tablet 5  . atorvastatin (LIPITOR) 20 MG tablet Take 1 tablet (20 mg total) by mouth daily. 30 tablet 5  . hydrochlorothiazide (HYDRODIURIL) 25 MG tablet Take 1 tablet (25 mg total) by mouth daily. 90 tablet 1  . lisinopril (PRINIVIL,ZESTRIL) 40 MG tablet Take 1 tablet (40 mg total) by mouth daily. 90 tablet 1  . ondansetron (ZOFRAN ODT) 4 MG disintegrating tablet Take 1 tablet (4 mg total) by mouth every 8 (eight) hours as needed for nausea or vomiting. (Patient not taking: Reported on 10/01/2016) 10 tablet 0  . sildenafil (VIAGRA) 100 MG tablet Take 0.5-1 tablets (50-100 mg total) by mouth daily as needed for erectile dysfunction. At least 24 hours between doses (Patient not taking: Reported on 11/27/2016) 30 tablet 3  . naproxen (NAPROSYN) 500 MG tablet Take 1 tablet by mouth 2 times daily with a meal.. (Patient not taking: Reported  on 11/27/2016) 60 tablet 3  . traMADol (ULTRAM) 50 MG tablet Take 1 tablet (50 mg total) by mouth every 12 (twelve) hours. (Patient not taking: Reported on 11/27/2016) 60 tablet 2   No facility-administered medications prior to visit.     ROS Review of Systems  Constitutional: Negative for activity change and appetite change.  HENT: Negative for sinus pressure and sore throat.   Eyes: Negative for visual disturbance.  Respiratory: Negative for cough, chest tightness and shortness of breath.   Cardiovascular: Negative for chest pain and leg swelling.  Gastrointestinal: Negative for abdominal distention, abdominal pain, constipation and diarrhea.  Endocrine: Negative.   Genitourinary: Negative for dysuria.  Musculoskeletal:       Chronic knee pain  Skin: Negative for rash.  Allergic/Immunologic: Negative.   Neurological: Negative for weakness, light-headedness and numbness.  Psychiatric/Behavioral: Negative for dysphoric mood and suicidal ideas.    Objective:  BP (!) 149/94 (BP Location: Right Arm, Patient Position: Sitting, Cuff Size: Small)   Pulse 81   Temp 97.8 F (36.6 C) (Oral)   Ht 6\' 7"  (2.007 m)   Wt 254 lb 9.6 oz (115.5 kg)   SpO2 98%   BMI 28.68 kg/m   BP/Weight 12/03/2016 11/27/2016 11/13/2016  Systolic BP 149 161 194  Diastolic BP 94 110 113  Wt. (Lbs) 254.6 252.2 283.2  BMI 28.68 28.59 31.9      Physical Exam Constitutional: Patient appears well-developed and well-nourished. No distress. HENT: Normocephalic, atraumatic, External right  and left ear normal. Oropharynx is clear and moist.  Eyes: Conjunctivae and EOM are normal. PERRLA, no scleral icterus. Neck: Normal ROM. Neck supple. No JVD. No tracheal deviation. No thyromegaly. CVS: Bradycardic rate, regular rate, S1/S2 +, no murmurs, no gallops, no carotid bruit.  Pulmonary: Effort and breath sounds normal, no stridor, rhonchi, wheezes, rales.  Abdominal: Soft. BS +,  no distension, tenderness, rebound or  guarding.  Musculoskeletal: Crepitus on range of motion of both knees with associated tenderness Lymphadenopathy: No lymphadenopathy noted, cervical, inguinal or axillary Neuro: Alert. Normal reflexes, muscle tone coordination. No cranial nerve deficit. Skin: Skin is warm and dry. No rash noted. Not diaphoretic. No erythema. No pallor. Psychiatric: Normal mood and affect. Behavior, judgment, thought content normal.  CMP Latest Ref Rng & Units 10/01/2016 08/22/2016 01/07/2016  Glucose 65 - 99 mg/dL 84 92 92  BUN 7 - 25 mg/dL 15 16 10   Creatinine 0.70 - 1.33 mg/dL 1.611.03 0.96(E1.32(H) 4.541.09  Sodium 135 - 146 mmol/L 139 133(L) 142  Potassium 3.5 - 5.3 mmol/L 4.5 3.4(L) 4.2  Chloride 98 - 110 mmol/L 106 96(L) 108  CO2 20 - 31 mmol/L 26 26 25   Calcium 8.6 - 10.3 mg/dL 8.9 9.0 8.9  Total Protein 6.5 - 8.1 g/dL - 7.6 -  Total Bilirubin 0.3 - 1.2 mg/dL - 0.9 -  Alkaline Phos 38 - 126 U/L - 51 -  AST 15 - 41 U/L - 20 -  ALT 17 - 63 U/L - 10(L) -    Lipid Panel     Component Value Date/Time   CHOL 218 (H) 02/22/2016 0959   TRIG 126 02/22/2016 0959   HDL 63 02/22/2016 0959   CHOLHDL 3.5 02/22/2016 0959   VLDL 25 02/22/2016 0959   LDLCALC 130 (H) 02/22/2016 0959     Assessment & Plan:   1. Primary osteoarthritis of both knees Uncontrolled Unable to take NSAIDs due to worsening of blood pressure Refill tramadol - traMADol (ULTRAM) 50 MG tablet; Take 1 tablet (50 mg total) by mouth every 12 (twelve) hours.  Dispense: 60 tablet; Refill: 1  2. Essential hypertension Not fully optimize was significantly improved Target blood pressure is less than 130/80 Continue lisinopril, hydrochlorothiazide and amlodipine.  3. Pure hypercholesterolemia Uncontrolled Continue Lipitor.  Low Cholesterol diet   Meds ordered this encounter  Medications  . traMADol (ULTRAM) 50 MG tablet    Sig: Take 1 tablet (50 mg total) by mouth every 12 (twelve) hours.    Dispense:  60 tablet    Refill:  1     Follow-up: Return in about 3 months (around 03/05/2017) for follow up on hypertension and hyperlipidemia.   Jaclyn ShaggyEnobong Amao MD

## 2016-12-04 ENCOUNTER — Other Ambulatory Visit: Payer: Self-pay | Admitting: Family Medicine

## 2016-12-04 ENCOUNTER — Other Ambulatory Visit: Payer: Self-pay

## 2016-12-04 DIAGNOSIS — N529 Male erectile dysfunction, unspecified: Secondary | ICD-10-CM

## 2016-12-04 MED ORDER — SILDENAFIL CITRATE 100 MG PO TABS: 50.0000 mg | ORAL_TABLET | Freq: Every day | ORAL | 3 refills | Status: DC | PRN

## 2016-12-12 MED FILL — HYDROCHLOROTHIAZIDE 25 MG T: 25 | 30 days supply | Qty: 30 | Fill #1

## 2016-12-12 MED FILL — LISINOPRIL 40 MG TABLET: 40 | 30 days supply | Qty: 30 | Fill #1

## 2016-12-15 ENCOUNTER — Telehealth: Payer: Self-pay

## 2016-12-15 NOTE — Telephone Encounter (Signed)
Called to find out if pt would like to have colonoscopy scheduled 

## 2017-01-22 MED FILL — ATORVASTATIN 20 MG TABLET: 20 | 30 days supply | Qty: 30 | Fill #1

## 2017-01-22 MED FILL — LISINOPRIL 40 MG TABLET: 40 | 30 days supply | Qty: 30 | Fill #2

## 2017-01-22 MED FILL — HYDROCHLOROTHIAZIDE 25 MG T: 25 | 30 days supply | Qty: 30 | Fill #2

## 2017-03-04 MED FILL — HYDROCHLOROTHIAZIDE 25 MG T: 25 | 30 days supply | Qty: 30 | Fill #3

## 2017-03-04 MED FILL — ?ATORVASTATIN 20 MG TABLET: 20 | 30 days supply | Qty: 30 | Fill #2

## 2017-03-04 MED FILL — LISINOPRIL 40 MG TABLET: 40 | 30 days supply | Qty: 30 | Fill #3

## 2017-05-07 MED FILL — ?ATORVASTATIN 20 MG TABLET: 20 | 30 days supply | Qty: 30 | Fill #3

## 2017-05-07 MED FILL — HYDROCHLOROTHIAZIDE 25 MG T: 25 | 30 days supply | Qty: 30 | Fill #4

## 2017-05-07 MED FILL — LISINOPRIL 40 MG TABLET: 40 | 30 days supply | Qty: 30 | Fill #4

## 2017-05-27 ENCOUNTER — Encounter: Payer: Self-pay | Admitting: Family Medicine

## 2017-05-27 ENCOUNTER — Ambulatory Visit: Payer: Self-pay | Attending: Family Medicine | Admitting: Family Medicine

## 2017-05-27 VITALS — BP 145/97 | HR 76 | Temp 97.9°F | Ht 79.0 in | Wt 249.6 lb

## 2017-05-27 DIAGNOSIS — R140 Abdominal distension (gaseous): Secondary | ICD-10-CM

## 2017-05-27 DIAGNOSIS — I10 Essential (primary) hypertension: Secondary | ICD-10-CM | POA: Insufficient documentation

## 2017-05-27 DIAGNOSIS — E785 Hyperlipidemia, unspecified: Secondary | ICD-10-CM | POA: Insufficient documentation

## 2017-05-27 DIAGNOSIS — E784 Other hyperlipidemia: Secondary | ICD-10-CM

## 2017-05-27 DIAGNOSIS — N529 Male erectile dysfunction, unspecified: Secondary | ICD-10-CM | POA: Insufficient documentation

## 2017-05-27 DIAGNOSIS — E7849 Other hyperlipidemia: Secondary | ICD-10-CM

## 2017-05-27 MED ORDER — SIMETHICONE 80 MG PO CHEW: 80.0000 mg | CHEWABLE_TABLET | Freq: Four times a day (QID) | ORAL | 1 refills | Status: DC | PRN

## 2017-05-27 MED ORDER — ATORVASTATIN CALCIUM 20 MG PO TABS: 20.0000 mg | ORAL_TABLET | Freq: Every day | ORAL | 5 refills | Status: DC

## 2017-05-27 MED ORDER — SILDENAFIL CITRATE 50 MG PO TABS: 50.0000 mg | ORAL_TABLET | Freq: Every day | ORAL | 1 refills | Status: DC | PRN

## 2017-05-27 MED ORDER — HYDROCHLOROTHIAZIDE 25 MG PO TABS: 25.0000 mg | ORAL_TABLET | Freq: Every day | ORAL | 5 refills | Status: DC

## 2017-05-27 MED ORDER — LISINOPRIL 40 MG PO TABS: 40.0000 mg | ORAL_TABLET | Freq: Every day | ORAL | 5 refills | Status: DC

## 2017-05-27 MED ORDER — AMLODIPINE BESYLATE 10 MG PO TABS: 10.0000 mg | ORAL_TABLET | Freq: Every day | ORAL | 5 refills | Status: DC

## 2017-05-27 NOTE — Progress Notes (Signed)
Subjective:  Patient ID: Phillip Oliver, male    DOB: 10-26-1965  Age: 51 y.o. MRN: [id number]  CC: Hypertension   HPI Phillip Oliver is a 51 year old male with a history of hypertension, hyperlipidemia, erectile dysfunction who presents today for a follow-up visit.  He has been compliant with his antihypertensive but not a low-sodium diet or exercise. He tolerates his statin and denies any myalgias or side effects.  His erectile dysfunction is not controlled he states and attributes this to his blood pressure medications. He had previously been prescribed 25 mg of Viagra which he states was ineffective and at other times he wasn't able to afford.  He is accompanied by his spouse today and they both complain of excessive abdominal gassiness and flatulence which has been present for the last 1 year. He denies abdominal pain, nausea, vomiting, diarrhea or constipation.  Past Medical History:  Diagnosis Date  . Hypertension     No past surgical history on file.  Allergies  Allergen Reactions  . Tylenol [Acetaminophen] Other (See Comments)    Patient states that his mother told him this "years ago" as a child     Outpatient Medications Prior to Visit  Medication Sig Dispense Refill  . amLODipine (NORVASC) 10 MG tablet Take 1 tablet (10 mg total) by mouth daily. 30 tablet 5  . atorvastatin (LIPITOR) 20 MG tablet Take 1 tablet (20 mg total) by mouth daily. 30 tablet 5  . hydrochlorothiazide (HYDRODIURIL) 25 MG tablet Take 1 tablet (25 mg total) by mouth daily. 90 tablet 1  . ondansetron (ZOFRAN ODT) 4 MG disintegrating tablet Take 1 tablet (4 mg total) by mouth every 8 (eight) hours as needed for nausea or vomiting. (Patient not taking: Reported on 10/01/2016) 10 tablet 0  . lisinopril (PRINIVIL,ZESTRIL) 40 MG tablet Take 1 tablet (40 mg total) by mouth daily. (Patient not taking: Reported on 05/27/2017) 90 tablet 1  . sildenafil (VIAGRA) 100 MG tablet Take 0.5-1 tablets (50-100 mg  total) by mouth daily as needed for erectile dysfunction. At least 24 hours between doses (Patient not taking: Reported on 05/27/2017) 30 tablet 3  . traMADol (ULTRAM) 50 MG tablet Take 1 tablet (50 mg total) by mouth every 12 (twelve) hours. (Patient not taking: Reported on 05/27/2017) 60 tablet 1   No facility-administered medications prior to visit.     ROS Review of Systems  Constitutional: Negative for activity change and appetite change.  HENT: Negative for sinus pressure and sore throat.   Eyes: Negative for visual disturbance.  Respiratory: Negative for cough, chest tightness and shortness of breath.   Cardiovascular: Negative for chest pain and leg swelling.  Gastrointestinal: Negative for abdominal distention, abdominal pain, constipation and diarrhea.  Endocrine: Negative.   Genitourinary: Negative for dysuria.  Musculoskeletal: Negative for joint swelling and myalgias.  Skin: Negative for rash.  Allergic/Immunologic: Negative.   Neurological: Negative for weakness, light-headedness and numbness.  Psychiatric/Behavioral: Negative for dysphoric mood and suicidal ideas.    Objective:  BP (!) 145/97   Pulse 76   Temp 97.9 F (36.6 C) (Oral)   Ht  (2.007 m)   Wt 249 lb 9.6 oz (113.2 kg)   SpO2 100%   BMI 28.12 kg/m   BP/Weight 05/27/2017 12/03/2016 11/27/2016  Systolic BP 145 149 161  Diastolic BP 97 94 110  Wt. (Lbs) 249.6 254.6 252.2  BMI 28.12 28.68 28.59      Physical Exam  Constitutional: He is oriented to person, place, and time.  He appears well-developed and well-nourished.  Cardiovascular: Normal rate, normal heart sounds and intact distal pulses.   No murmur heard. Pulmonary/Chest: Effort normal and breath sounds normal. He has no wheezes. He has no rales. He exhibits no tenderness.  Abdominal: Soft. Bowel sounds are normal. He exhibits no distension and no mass. There is no tenderness.  Hyperactive bowel sounds  Musculoskeletal: Normal range of  motion.  Crepitus of the knee on flexion and extension  Neurological: He is alert and oriented to person, place, and time.  Skin: Skin is warm and dry.  Psychiatric: He has a normal mood and affect.     CMP Latest Ref Rng & Units 10/01/2016 08/22/2016 01/07/2016  Glucose 65 - 99 mg/dL 84 92 92  BUN 7 - 25 mg/dL Creatinine 0.70 - 1.33 mg/dL 4.09 8.11(B) 1.47  Sodium 135 - 146 mmol/L 139 133(L) 142  Potassium 3.5 - 5.3 mmol/L 4.5 3.4(L) 4.2  Chloride 98 - 110 mmol/L 106 96(L) 108  CO2 20 - 31 mmol/L Calcium 8.6 - 10.3 mg/dL 8.9 9.0 8.9  Total Protein 6.5 - 8.1 g/dL - 7.6 -  Total Bilirubin 0.3 - 1.2 mg/dL - 0.9 -  Alkaline Phos 38 - 126 U/L - 51 -  AST 15 - 41 U/L - 20 -  ALT 17 - 63 U/L - 10(L) -    Lipid Panel     Component Value Date/Time   CHOL 218 (H) 02/22/2016 0959   TRIG 126 02/22/2016 0959   HDL 63 02/22/2016 0959   CHOLHDL 3.5 02/22/2016 0959   VLDL 25 02/22/2016 0959   LDLCALC 130 (H) 02/22/2016 0959    Assessment & Plan:   1. Other hyperlipidemia Uncontrolled Lipid panel at next visit - atorvastatin (LIPITOR) 20 MG tablet; Take 1 tablet (20 mg total) by mouth daily.  Dispense: 30 tablet; Refill: 5  2. Hypertension, unspecified type Slightly elevated above goal of less than 120/80 Low-sodium diet - hydrochlorothiazide (HYDRODIURIL) 25 MG tablet; Take 1 tablet (25 mg total) by mouth daily.  Dispense: 30 tablet; Refill: 5 - lisinopril (PRINIVIL,ZESTRIL) 40 MG tablet; Take 1 tablet (40 mg total) by mouth daily.  Dispense: 30 tablet; Refill: 5  3. Erectile dysfunction, unspecified erectile dysfunction type Uncontrolled Increase sildenafil dose - sildenafil (VIAGRA) 50 MG tablet; Take 1 tablet (50 mg total) by mouth daily as needed for erectile dysfunction. At least 24 hours between doses  Dispense: 30 tablet; Refill: 1  4. Gassiness Advised to keep a food diary and avoid foods that cause excessive gassiness We'll check H. pylori breath test  at next visit if persisting Meanwhile use simethicone   Meds ordered this encounter  Medications  . simethicone (GAS-X) 80 MG chewable tablet    Sig: Chew 1 tablet (80 mg total) by mouth every 6 (six) hours as needed for flatulence.    Dispense:  120 tablet    Refill:  1  . amLODipine (NORVASC) 10 MG tablet    Sig: Take 1 tablet (10 mg total) by mouth daily.    Dispense:  30 tablet    Refill:  5  . atorvastatin (LIPITOR) 20 MG tablet    Sig: Take 1 tablet (20 mg total) by mouth daily.    Dispense:  30 tablet    Refill:  5  . hydrochlorothiazide (HYDRODIURIL) 25 MG tablet    Sig: Take 1 tablet (25 mg total) by mouth daily.    Dispense:  30  tablet    Refill:  5  . lisinopril (PRINIVIL,ZESTRIL) 40 MG tablet    Sig: Take 1 tablet (40 mg total) by mouth daily.    Dispense:  30 tablet    Refill:  5  . sildenafil (VIAGRA) 50 MG tablet    Sig: Take 1 tablet (50 mg total) by mouth daily as needed for erectile dysfunction. At least 24 hours between doses    Dispense:  30 tablet    Refill:  1    Follow-up: Return in about 3 weeks (around 06/17/2017) for Follow-up of gassiness.   Jaclyn Shaggy MD

## 2017-05-27 NOTE — Patient Instructions (Signed)

## 2017-06-08 MED FILL — ?ATORVASTATIN 20 MG TABLET: 20 | 30 days supply | Qty: 30 | Fill #0

## 2017-06-08 MED FILL — LISINOPRIL 40 MG TABLET: 40 | 30 days supply | Qty: 30 | Fill #0

## 2017-06-08 MED FILL — $Viagra 50mg tablet: 50 | 30 days supply | Qty: 10 | Fill #0

## 2017-06-08 MED FILL — AMLODIPINE BESYLATE 10 MG T: 10 | 30 days supply | Qty: 30 | Fill #0

## 2017-06-08 MED FILL — HYDROCHLOROTHIAZIDE 25 MG T: 25 | 30 days supply | Qty: 30 | Fill #0

## 2017-06-22 ENCOUNTER — Encounter: Payer: Self-pay | Admitting: Family Medicine

## 2017-06-22 ENCOUNTER — Ambulatory Visit: Payer: Self-pay | Attending: Family Medicine | Admitting: Family Medicine

## 2017-06-22 VITALS — BP 115/81 | HR 77 | Temp 97.8°F | Ht 79.0 in | Wt 245.6 lb

## 2017-06-22 DIAGNOSIS — R140 Abdominal distension (gaseous): Secondary | ICD-10-CM | POA: Insufficient documentation

## 2017-06-22 DIAGNOSIS — K5900 Constipation, unspecified: Secondary | ICD-10-CM | POA: Insufficient documentation

## 2017-06-22 DIAGNOSIS — I10 Essential (primary) hypertension: Secondary | ICD-10-CM | POA: Insufficient documentation

## 2017-06-22 DIAGNOSIS — E785 Hyperlipidemia, unspecified: Secondary | ICD-10-CM | POA: Insufficient documentation

## 2017-06-22 DIAGNOSIS — K219 Gastro-esophageal reflux disease without esophagitis: Secondary | ICD-10-CM | POA: Insufficient documentation

## 2017-06-22 DIAGNOSIS — Z79899 Other long term (current) drug therapy: Secondary | ICD-10-CM | POA: Insufficient documentation

## 2017-06-22 DIAGNOSIS — M170 Bilateral primary osteoarthritis of knee: Secondary | ICD-10-CM | POA: Insufficient documentation

## 2017-06-22 MED ORDER — DICLOFENAC SODIUM 1 % TD GEL: 4.0000 g | Freq: Four times a day (QID) | TRANSDERMAL | 1 refills | Status: DC

## 2017-06-22 MED ORDER — POLYETHYLENE GLYCOL 3350 17 GM/SCOOP PO POWD: 17.0000 g | Freq: Every day | ORAL | 1 refills | Status: DC

## 2017-06-22 NOTE — Patient Instructions (Signed)
Abdominal Bloating °When you have abdominal bloating, your abdomen may feel full, tight, or painful. It may also look bigger than normal or swollen (distended). Common causes of abdominal bloating include: °· Swallowing air. °· Constipation. °· Problems digesting food. °· Eating too much. °· Irritable bowel syndrome. This is a condition that affects the large intestine. °· Lactose intolerance. This is an inability to digest lactose, a natural sugar in dairy products. °· Celiac disease. This is a condition that affects the ability to digest gluten, a protein found in some grains. °· Gastroparesis. This is a condition that slows down the movement of food in the stomach and small intestine. It is more common in people with diabetes mellitus. °· Gastroesophageal reflux disease (GERD). This is a digestive condition that makes stomach acid flow back into the esophagus. °· Urinary retention. This means that the body is holding onto urine, and the bladder cannot be emptied all the way. ° °Follow these instructions at home: °Eating and drinking °· Avoid eating too much. °· Try not to swallow air while talking or eating. °· Avoid eating while lying down. °· Avoid these foods and drinks: °? Foods that cause gas, such as broccoli, cabbage, cauliflower, and baked beans. °? Carbonated drinks. °? Hard candy. °? Chewing gum. °Medicines °· Take over-the-counter and prescription medicines only as told by your health care provider. °· Take probiotic medicines. These medicines contain live bacteria or yeasts that can help digestion. °· Take coated peppermint oil capsules. °Activity °· Try to exercise regularly. Exercise may help to relieve bloating that is caused by gas and relieve constipation. °General instructions °· Keep all follow-up visits as told by your health care provider. This is important. °Contact a health care provider if: °· You have nausea and vomiting. °· You have diarrhea. °· You have abdominal pain. °· You have  unusual weight loss or weight gain. °· You have severe pain, and medicines do not help. °Get help right away if: °· You have severe chest pain. °· You have trouble breathing. °· You have shortness of breath. °· You have trouble urinating. °· You have darker urine than normal. °· You have blood in your stools or have dark, tarry stools. °Summary °· Abdominal bloating means that the abdomen is swollen. °· Common causes of abdominal bloating are swallowing air, constipation, and problems digesting food. °· Avoid eating too much and avoid swallowing air. °· Avoid foods that cause gas, carbonated drinks, hard candy, and chewing gum. °This information is not intended to replace advice given to you by your health care provider. Make sure you discuss any questions you have with your health care provider. °Document Released: 09/26/2016 Document Revised: 09/26/2016 Document Reviewed: 09/26/2016 °Elsevier Interactive Patient Education © 2018 [employer] ° °
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
## 2017-06-23 ENCOUNTER — Ambulatory Visit: Payer: Self-pay | Attending: Family Medicine

## 2017-06-23 DIAGNOSIS — I10 Essential (primary) hypertension: Secondary | ICD-10-CM | POA: Insufficient documentation

## 2017-06-23 NOTE — Progress Notes (Signed)
Patient here for lab visit only 

## 2017-06-24 LAB — LIPID PANEL
CHOLESTEROL TOTAL: 144 mg/dL (ref 100–199)
Chol/HDL Ratio: 3.5 ratio (ref 0.0–5.0)
HDL: 41 mg/dL (ref >39)
LDL CALC: 83 mg/dL (ref 0–99)
TRIGLYCERIDES: 102 mg/dL (ref 0–149)
VLDL CHOLESTEROL CAL: 20 mg/dL (ref 5–40)

## 2017-06-24 LAB — CMP14+EGFR
A/G RATIO: 1.4 (ref 1.2–2.2)
ALT: 11 IU/L (ref 0–44)
AST: 20 IU/L (ref 0–40)
Albumin: 3.9 g/dL (ref 3.5–5.5)
Alkaline Phosphatase: 47 IU/L (ref 39–117)
BUN/Creatinine Ratio: 9 (ref 9–20)
BUN: 10 mg/dL (ref 6–24)
Bilirubin Total: 0.5 mg/dL (ref 0.0–1.2)
CALCIUM: 9 mg/dL (ref 8.7–10.2)
CHLORIDE: 98 mmol/L (ref 96–106)
CO2: 27 mmol/L (ref 20–29)
CREATININE: 1.17 mg/dL (ref 0.76–1.27)
GFR calc Af Amer: 82 mL/min/{1.73_m2} (ref >59)
GFR calc non Af Amer: 71 mL/min/{1.73_m2} (ref >59)
GLOBULIN, TOTAL: 2.7 g/dL (ref 1.5–4.5)
Glucose: 99 mg/dL (ref 65–99)
Potassium: 4.2 mmol/L (ref 3.5–5.2)
Sodium: 138 mmol/L (ref 134–144)
TOTAL PROTEIN: 6.6 g/dL (ref 6.0–8.5)

## 2017-06-25 ENCOUNTER — Other Ambulatory Visit: Payer: Self-pay | Admitting: Family Medicine

## 2017-06-25 DIAGNOSIS — K2970 Gastritis, unspecified, without bleeding: Principal | ICD-10-CM

## 2017-06-25 DIAGNOSIS — B9681 Helicobacter pylori [H. pylori] as the cause of diseases classified elsewhere: Secondary | ICD-10-CM

## 2017-06-25 LAB — H. PYLORI BREATH TEST: H PYLORI BREATH TEST: POSITIVE — AB

## 2017-06-25 MED ORDER — AMOXICILLIN 500 MG PO CAPS
1000.0000 mg | ORAL_CAPSULE | Freq: Two times a day (BID) | ORAL | 0 refills | Status: DC
Start: 2017-06-25 — End: 2017-07-23

## 2017-06-25 MED ORDER — CLARITHROMYCIN 500 MG PO TABS: 500.0000 mg | ORAL_TABLET | Freq: Two times a day (BID) | ORAL | 0 refills | Status: DC

## 2017-06-25 MED ORDER — OMEPRAZOLE 20 MG PO CPDR: 20.0000 mg | DELAYED_RELEASE_CAPSULE | Freq: Two times a day (BID) | ORAL | 0 refills | Status: DC

## 2017-06-25 MED FILL — ?OMEPRAZOLE DR 20 MG CAPSUL: 20 | 14 days supply | Qty: 28 | Fill #0

## 2017-06-25 MED FILL — AMOXICILLIN 500 MG CAPSULE: 500 | 14 days supply | Qty: 56 | Fill #0

## 2017-06-25 MED FILL — CLARITHROMYCIN 500 MG TAB: 500 | 14 days supply | Qty: 28 | Fill #0

## 2017-06-26 ENCOUNTER — Telehealth: Payer: Self-pay

## 2017-06-26 NOTE — Telephone Encounter (Signed)
Pt was called and informed of lab results. 

## 2017-07-16 ENCOUNTER — Telehealth: Payer: Self-pay | Admitting: Family Medicine

## 2017-07-16 NOTE — Telephone Encounter (Signed)
Pt called to try to speak with the nurse or the pcp sincce his both knee is swollen and can't walk, he asking if possible to have a doctor note for work that way he won't loose his job, please call him back

## 2017-07-23 ENCOUNTER — Encounter: Payer: Self-pay | Admitting: Family Medicine

## 2017-07-23 ENCOUNTER — Ambulatory Visit: Payer: Self-pay | Attending: Family Medicine | Admitting: Family Medicine

## 2017-07-23 VITALS — BP 145/95 | HR 83 | Temp 98.1°F | Ht 79.0 in | Wt 249.2 lb

## 2017-07-23 DIAGNOSIS — M170 Bilateral primary osteoarthritis of knee: Secondary | ICD-10-CM

## 2017-07-23 DIAGNOSIS — Z1211 Encounter for screening for malignant neoplasm of colon: Secondary | ICD-10-CM

## 2017-07-23 DIAGNOSIS — Z79899 Other long term (current) drug therapy: Secondary | ICD-10-CM | POA: Insufficient documentation

## 2017-07-23 DIAGNOSIS — I10 Essential (primary) hypertension: Secondary | ICD-10-CM

## 2017-07-23 DIAGNOSIS — E7849 Other hyperlipidemia: Secondary | ICD-10-CM

## 2017-07-23 DIAGNOSIS — N529 Male erectile dysfunction, unspecified: Secondary | ICD-10-CM | POA: Insufficient documentation

## 2017-07-23 MED ORDER — AMLODIPINE BESYLATE 10 MG PO TABS: 10.0000 mg | ORAL_TABLET | Freq: Every day | ORAL | 5 refills | Status: DC

## 2017-07-23 MED ORDER — LISINOPRIL 40 MG PO TABS: 40.0000 mg | ORAL_TABLET | Freq: Every day | ORAL | 5 refills | Status: DC

## 2017-07-23 MED ORDER — DICLOFENAC SODIUM 1 % TD GEL: 4.0000 g | Freq: Four times a day (QID) | TRANSDERMAL | 1 refills | Status: DC

## 2017-07-23 MED ORDER — HYDROCHLOROTHIAZIDE 25 MG PO TABS: 25.0000 mg | ORAL_TABLET | Freq: Every day | ORAL | 5 refills | Status: DC

## 2017-07-23 MED ORDER — ATORVASTATIN CALCIUM 20 MG PO TABS: 20.0000 mg | ORAL_TABLET | Freq: Every day | ORAL | 5 refills | Status: DC

## 2017-07-23 MED FILL — ATORVASTATIN 20 MG TABLET: 20 | 30 days supply | Qty: 30 | Fill #0

## 2017-07-23 MED FILL — VOLTAREN 1% GEL: 1 | 6 days supply | Qty: 100 | Fill #0

## 2017-07-23 MED FILL — HYDROCHLOROTHIAZIDE 25 MG T: 25 | 30 days supply | Qty: 30 | Fill #0

## 2017-07-23 MED FILL — AMLODIPINE BESYLATE 10 MG T: 10 | 30 days supply | Qty: 30 | Fill #0

## 2017-07-23 MED FILL — LISINOPRIL 40 MG TAB: 40 | 30 days supply | Qty: 30 | Fill #0

## 2017-07-23 NOTE — Patient Instructions (Signed)

## 2017-07-23 NOTE — Progress Notes (Signed)
Subjective:  Patient ID: Phillip Riegerimothy Marszalek, male    DOB: 12/23/1965  Age: 51 y.o. MRN: [id number]  CC: Abdominal Pain and Knee Pain   HPI Phillip Oliver is a 51 year old male with a history of hypertension, hyperlipidemia, erectile dysfunction who recently completed a course of antibiotics for Helicobacter pylori gastritis and reports significant improvement in his abdominal symptoms.  Today he complains of worsening knee pain bilaterally from osteoarthritis and Tylenol has not helped neither has his wife's tramadol pills which he took.  He attempted working for 2 days where he worked to 12-hour shifts which caused exacerbation of his knee pain with associated swelling; pain is described as a 10/10. Knee x-rays from 06/2016 revealed tricompartmental osteoarthritis of the left knee.  His blood pressure is slightly elevated and he endorses taking his antihypertensives but on further questioning he informs me he has been taking 2 medications rather than 3 which were been prescribed. He has been compliant with his statin and denies myalgias.  Does not exercise regularly and is not adherent with a low-sodium, low-cholesterol diet.  Past Medical History:  Diagnosis Date  . Hypertension     No past surgical history on file.  Allergies  Allergen Reactions  . Tylenol [Acetaminophen] Other (See Comments)    Patient states that his mother told him this "years ago" as a child     Outpatient Medications Prior to Visit  Medication Sig Dispense Refill  . sildenafil (VIAGRA) 50 MG tablet Take 1 tablet (50 mg total) by mouth daily as needed for erectile dysfunction. At least 24 hours between doses 30 tablet 1  . amLODipine (NORVASC) 10 MG tablet Take 1 tablet (10 mg total) by mouth daily. 30 tablet 5  . atorvastatin (LIPITOR) 20 MG tablet Take 1 tablet (20 mg total) by mouth daily. 30 tablet 5  . hydrochlorothiazide (HYDRODIURIL) 25 MG tablet Take 1 tablet (25 mg total) by mouth daily. 30 tablet 5    . amoxicillin (AMOXIL) 500 MG capsule Take 2 capsules (1,000 mg total) by mouth 2 (two) times daily. (Patient not taking: Reported on 07/23/2017) 56 capsule 0  . clarithromycin (BIAXIN) 500 MG tablet Take 1 tablet (500 mg total) by mouth 2 (two) times daily. (Patient not taking: Reported on 07/23/2017) 28 tablet 0  . diclofenac sodium (VOLTAREN) 1 % GEL Apply 4 g topically 4 (four) times daily. (Patient not taking: Reported on 07/23/2017) 100 g 1  . lisinopril (PRINIVIL,ZESTRIL) 40 MG tablet Take 1 tablet (40 mg total) by mouth daily. (Patient not taking: Reported on 06/22/2017) 30 tablet 5  . omeprazole (PRILOSEC) 20 MG capsule Take 1 capsule (20 mg total) by mouth 2 (two) times daily before a meal. (Patient not taking: Reported on 07/23/2017) 28 capsule 0  . ondansetron (ZOFRAN ODT) 4 MG disintegrating tablet Take 1 tablet (4 mg total) by mouth every 8 (eight) hours as needed for nausea or vomiting. (Patient not taking: Reported on 10/01/2016) 10 tablet 0  . polyethylene glycol powder (GLYCOLAX/MIRALAX) powder Take 17 g by mouth daily. (Patient not taking: Reported on 07/23/2017) 3350 g 1  . simethicone (GAS-X) 80 MG chewable tablet Chew 1 tablet (80 mg total) by mouth every 6 (six) hours as needed for flatulence. (Patient not taking: Reported on 07/23/2017) 120 tablet 1   No facility-administered medications prior to visit.     ROS Review of Systems  Constitutional: Negative for activity change and appetite change.  HENT: Negative for sinus pressure and sore throat.   Eyes:  Negative for visual disturbance.  Respiratory: Negative for cough, chest tightness and shortness of breath.   Cardiovascular: Negative for chest pain and leg swelling.  Gastrointestinal: Negative for abdominal distention, abdominal pain, constipation and diarrhea.  Endocrine: Negative.   Genitourinary: Negative for dysuria.  Musculoskeletal:       See hpi  Skin: Negative for rash.  Allergic/Immunologic: Negative.    Neurological: Negative for weakness, light-headedness and numbness.  Psychiatric/Behavioral: Negative for dysphoric mood and suicidal ideas.    Objective:  BP (!) 145/95   Pulse 83   Temp 98.1 F (36.7 C) (Oral)   Ht 6\' 7"  (2.007 m)   Wt 249 lb 3.2 oz (113 kg)   SpO2 97%   BMI 28.07 kg/m   BP/Weight 07/23/2017 06/22/2017 05/27/2017  Systolic BP 145 115 145  Diastolic BP 95 81 97  Wt. (Lbs) 249.2 245.6 249.6  BMI 28.07 27.67 28.12      Physical Exam  Constitutional: He is oriented to person, place, and time. He appears well-developed and well-nourished.  Cardiovascular: Normal rate, normal heart sounds and intact distal pulses.  No murmur heard. Pulmonary/Chest: Effort normal and breath sounds normal. He has no wheezes. He has no rales. He exhibits no tenderness.  Abdominal: Soft. Bowel sounds are normal. He exhibits no distension and no mass. There is no tenderness.  Musculoskeletal:  Normal appearance bilaterally TTP lateral joint line of right knee Crepitus on range of motion bilaterally   Neurological: He is alert and oriented to person, place, and time.     Assessment & Plan:   1. Primary osteoarthritis of both knees Declines steroids due to tendency to increase his blood pressure which affects his ability to donate blood Advised to use knee brace - diclofenac sodium (VOLTAREN) 1 % GEL; Apply 4 g 4 (four) times daily topically.  Dispense: 100 g; Refill: 1  2. Hypertension, unspecified type Above goal of less than 130/80 due to the fact that he has been taking 2 rather than 3 prescribed antihypertensives. I have refilled all his medications Low-sodium diet - amLODipine (NORVASC) 10 MG tablet; Take 1 tablet (10 mg total) daily by mouth.  Dispense: 30 tablet; Refill: 5 - lisinopril (PRINIVIL,ZESTRIL) 40 MG tablet; Take 1 tablet (40 mg total) daily by mouth.  Dispense: 30 tablet; Refill: 5 - hydrochlorothiazide (HYDRODIURIL) 25 MG tablet; Take 1 tablet (25 mg  total) daily by mouth.  Dispense: 30 tablet; Refill: 5  3. Other hyperlipidemia Controlled - atorvastatin (LIPITOR) 20 MG tablet; Take 1 tablet (20 mg total) daily by mouth.  Dispense: 30 tablet; Refill: 5  4. Screening for colon cancer - Ambulatory referral to Gastroenterology   Meds ordered this encounter  Medications  . diclofenac sodium (VOLTAREN) 1 % GEL    Sig: Apply 4 g 4 (four) times daily topically.    Dispense:  100 g    Refill:  1  . amLODipine (NORVASC) 10 MG tablet    Sig: Take 1 tablet (10 mg total) daily by mouth.    Dispense:  30 tablet    Refill:  5  . lisinopril (PRINIVIL,ZESTRIL) 40 MG tablet    Sig: Take 1 tablet (40 mg total) daily by mouth.    Dispense:  30 tablet    Refill:  5  . hydrochlorothiazide (HYDRODIURIL) 25 MG tablet    Sig: Take 1 tablet (25 mg total) daily by mouth.    Dispense:  30 tablet    Refill:  5  . atorvastatin (LIPITOR) 20  MG tablet    Sig: Take 1 tablet (20 mg total) daily by mouth.    Dispense:  30 tablet    Refill:  5    Follow-up: Return in about 3 months (around 10/23/2017) for Follow Up of hypertension and hyperlipidemia.   Jaclyn Shaggy MD

## 2017-07-24 ENCOUNTER — Encounter: Payer: Self-pay | Admitting: Family Medicine

## 2017-08-13 ENCOUNTER — Inpatient Hospital Stay (HOSPITAL_COMMUNITY)
Admission: EM | Admit: 2017-08-13 | Discharge: 2017-08-15 | DRG: 684 | Disposition: A | Discharge status: Home / Self Care | Payer: Self-pay | Attending: Internal Medicine | Admitting: Internal Medicine

## 2017-08-13 ENCOUNTER — Encounter (HOSPITAL_COMMUNITY): Payer: Self-pay

## 2017-08-13 ENCOUNTER — Emergency Department (HOSPITAL_COMMUNITY): Payer: Self-pay

## 2017-08-13 DIAGNOSIS — M5126 Other intervertebral disc displacement, lumbar region: Secondary | ICD-10-CM

## 2017-08-13 DIAGNOSIS — N179 Acute kidney failure, unspecified: Principal | ICD-10-CM | POA: Diagnosis present

## 2017-08-13 DIAGNOSIS — Z833 Family history of diabetes mellitus: Secondary | ICD-10-CM

## 2017-08-13 DIAGNOSIS — R197 Diarrhea, unspecified: Secondary | ICD-10-CM | POA: Diagnosis present

## 2017-08-13 DIAGNOSIS — I441 Atrioventricular block, second degree: Secondary | ICD-10-CM | POA: Diagnosis present

## 2017-08-13 DIAGNOSIS — F17210 Nicotine dependence, cigarettes, uncomplicated: Secondary | ICD-10-CM | POA: Diagnosis present

## 2017-08-13 DIAGNOSIS — I10 Essential (primary) hypertension: Secondary | ICD-10-CM | POA: Diagnosis present

## 2017-08-13 DIAGNOSIS — E785 Hyperlipidemia, unspecified: Secondary | ICD-10-CM | POA: Diagnosis present

## 2017-08-13 DIAGNOSIS — R55 Syncope and collapse: Secondary | ICD-10-CM

## 2017-08-13 DIAGNOSIS — M5136 Other intervertebral disc degeneration, lumbar region: Secondary | ICD-10-CM

## 2017-08-13 DIAGNOSIS — E860 Dehydration: Secondary | ICD-10-CM | POA: Diagnosis present

## 2017-08-13 HISTORY — DX: Hyperlipidemia, unspecified: E78.5

## 2017-08-13 LAB — COMPREHENSIVE METABOLIC PANEL
ALK PHOS: 69 U/L (ref 38–126)
ALT: 17 U/L (ref 17–63)
ANION GAP: 10 (ref 5–15)
AST: 34 U/L (ref 15–41)
Albumin: 3.8 g/dL (ref 3.5–5.0)
BILIRUBIN TOTAL: 0.9 mg/dL (ref 0.3–1.2)
BUN: 12 mg/dL (ref 6–20)
CALCIUM: 9.1 mg/dL (ref 8.9–10.3)
CO2: 21 mmol/L — ABNORMAL LOW (ref 22–32)
Chloride: 103 mmol/L (ref 101–111)
Creatinine, Ser: 2.27 mg/dL — ABNORMAL HIGH (ref 0.61–1.24)
GFR calc Af Amer: 36 mL/min — ABNORMAL LOW (ref >60)
GFR, EST NON AFRICAN AMERICAN: 31 mL/min — AB (ref >60)
GLUCOSE: 86 mg/dL (ref 65–99)
POTASSIUM: 4.6 mmol/L (ref 3.5–5.1)
Sodium: 134 mmol/L — ABNORMAL LOW (ref 135–145)
TOTAL PROTEIN: 6.9 g/dL (ref 6.5–8.1)

## 2017-08-13 LAB — CBC
HEMATOCRIT: 44.1 % (ref 39.0–52.0)
HEMOGLOBIN: 14.7 g/dL (ref 13.0–17.0)
MCH: 30.7 pg (ref 26.0–34.0)
MCHC: 33.3 g/dL (ref 30.0–36.0)
MCV: 92.1 fL (ref 78.0–100.0)
Platelets: 187 10*3/uL (ref 150–400)
RBC: 4.79 MIL/uL (ref 4.22–5.81)
RDW: 14.3 % (ref 11.5–15.5)
WBC: 6.8 10*3/uL (ref 4.0–10.5)

## 2017-08-13 LAB — LIPASE, BLOOD: LIPASE: 27 U/L (ref 11–51)

## 2017-08-13 MED ORDER — KETOROLAC TROMETHAMINE 30 MG/ML IJ SOLN
30.0000 mg | Freq: Once | INTRAMUSCULAR | Status: AC
  Administered 2017-08-13: 30 mg via INTRAVENOUS
  Filled 2017-08-13: qty 1

## 2017-08-13 MED ORDER — SODIUM CHLORIDE 0.9 % IV BOLUS (SEPSIS)
1000.0000 mL | Freq: Once | INTRAVENOUS | Status: AC
  Administered 2017-08-13: 1000 mL via INTRAVENOUS

## 2017-08-13 MED ORDER — ONDANSETRON HCL 4 MG/2ML IJ SOLN
4.0000 mg | Freq: Once | INTRAMUSCULAR | Status: AC
  Administered 2017-08-13: 4 mg via INTRAVENOUS
  Filled 2017-08-13: qty 2

## 2017-08-13 NOTE — ED Notes (Signed)
Pt sleeping. 

## 2017-08-13 NOTE — ED Notes (Signed)
Patient denies pain and is resting comfortably.  

## 2017-08-13 NOTE — ED Provider Notes (Signed)
[facility]   CSN: [id number] Arrival date & time: 08/13/17  1938     History   Chief Complaint Chief Complaint  Patient presents with  . Near Syncope    HPI Phillip Oliver is a 51 y.o. male.  HPI Patient presents to the emergency room for evaluation of back pain and a syncopal episode.  Patient states he was cutting wood all day today.  He started having some pain in his lower back.  Patient went to go to the bathroom and while sitting down he started to feel very dizzy and lightheaded.  She fell from the toilet.  He denies hitting his head.  Patient denies any headache.  According to EMS he was diaphoretic when they first arrived.  Patient was able to get up and stand.  They checked his blood sugar and it was 109.  They gave him 500 cc of normal saline.  Patient is having some abdominal cramping and feels like he is dehydrated.  He denies any numbness or weakness in his extremities.  He denies any chest pain or shortness of breath.  No previous history of similar symptoms. Past Medical History:  Diagnosis Date  . Hypertension     Patient Active Problem List   Diagnosis Date Noted  . ARF (acute renal failure) (HCC) 08/13/2017  . Hyperlipidemia 02/25/2016  . Osteoarthritis 02/22/2016  . Erectile dysfunction 02/22/2016  . Essential hypertension 12/24/2015  . Tooth ache 12/24/2015  . Tobacco use disorder 12/24/2015    History reviewed. No pertinent surgical history.     Home Medications    Prior to Admission medications   Medication Sig Start Date End Date Taking? Authorizing Provider  amLODipine (NORVASC) 10 MG tablet Take 1 tablet (10 mg total) daily by mouth. 07/23/17  Yes Amao, Enobong, MD  atorvastatin (LIPITOR) 20 MG tablet Take 1 tablet (20 mg total) daily by mouth. 07/23/17  Yes Amao, Enobong, MD  diclofenac sodium (VOLTAREN) 1 % GEL Apply 4 g 4 (four) times daily topically. 07/23/17  Yes Jaclyn ShaggyAmao, Enobong, MD    hydrochlorothiazide (HYDRODIURIL) 25 MG tablet Take 1 tablet (25 mg total) daily by mouth. 07/23/17  Yes Amao, Enobong, MD  lisinopril (PRINIVIL,ZESTRIL) 40 MG tablet Take 1 tablet (40 mg total) daily by mouth. 07/23/17  Yes Amao, Enobong, MD  sildenafil (VIAGRA) 50 MG tablet Take 1 tablet (50 mg total) by mouth daily as needed for erectile dysfunction. At least 24 hours between doses 05/27/17  Yes Jaclyn ShaggyAmao, Enobong, MD    Family History History reviewed. No pertinent family history.  Social History Social History   Tobacco Use  . Smoking status: Current Every Day Smoker    Packs/day: 0.25    Types: Cigarettes  . Smokeless tobacco: Current User  Substance Use Topics  . Alcohol use: Yes    Alcohol/week: 2.4 - 3.6 oz    Types: 3 - 4 Cans of beer, 1 - 2 Shots of liquor per week    Comment: 6 times weekly  . Drug use: No     Allergies   Tylenol [acetaminophen]   Review of Systems Review of Systems  All other systems reviewed and are negative.    Physical Exam Updated Vital Signs BP (!) 93/59   Pulse 64   Resp 17   SpO2 100%   Physical Exam  Constitutional: He appears well-developed and well-nourished. No distress.  HENT:  Head: Normocephalic and atraumatic.  Right Ear: External ear normal.  Left  Ear: External ear normal.  Eyes: Conjunctivae are normal. Right eye exhibits no discharge. Left eye exhibits no discharge. No scleral icterus.  Neck: Neck supple. No tracheal deviation present.  Cardiovascular: Normal rate, regular rhythm and intact distal pulses.  Pulmonary/Chest: Effort normal and breath sounds normal. No stridor. No respiratory distress. He has no wheezes. He has no rales.  Abdominal: Soft. Bowel sounds are normal. He exhibits no distension. There is no tenderness. There is no rebound and no guarding.  Musculoskeletal: He exhibits no edema or tenderness.  Neurological: He is alert. He has normal strength. No cranial nerve deficit (no facial droop, extraocular  movements intact, no slurred speech) or sensory deficit. He exhibits normal muscle tone. He displays no seizure activity. Coordination normal.  Skin: Skin is warm and dry. No rash noted.  Psychiatric: He has a normal mood and affect.  Nursing note and vitals reviewed.    ED Treatments / Results  Labs (all labs ordered are listed, but only abnormal results are displayed) Labs Reviewed  COMPREHENSIVE METABOLIC PANEL - Abnormal; Notable for the following components:      Result Value   Sodium 134 (*)    CO2 21 (*)    Creatinine, Ser 2.27 (*)    GFR calc non Af Amer 31 (*)    GFR calc Af Amer 36 (*)    All other components within normal limits  CBC  LIPASE, BLOOD  URINALYSIS, ROUTINE W REFLEX MICROSCOPIC    EKG  EKG Interpretation  Date/Time:  Thursday August 13 2017 19:41:44 EST Ventricular Rate:  61 PR Interval:    QRS Duration: 84 QT Interval:  481 QTC Calculation: 485 R Axis:   61 Text Interpretation:  Sinus rhythm Consider left atrial enlargement Nonspecific T abnrm, anterolateral leads ST elev, probable normal early repol pattern Borderline prolonged QT interval No significant change since last tracing Confirmed by Linwood DibblesKnapp, Kendra Woolford [phone]) on 08/13/2017 7:49:36 PM       Radiology Ct Abdomen Pelvis Wo Contrast  Result Date: 08/13/2017 CLINICAL DATA:  Bilateral flank pain since this morning EXAM: CT ABDOMEN AND PELVIS WITHOUT CONTRAST TECHNIQUE: Multidetector CT imaging of the abdomen and pelvis was performed following the standard protocol without IV contrast. COMPARISON:  None. FINDINGS: LOWER CHEST: Lung bases are clear. Included heart size is normal. No pericardial effusion. HEPATOBILIARY: Liver and gallbladder are normal. PANCREAS: Normal. SPLEEN: Normal. ADRENALS/URINARY TRACT: Kidneys are orthotopic. No nephrolithiasis, hydronephrosis or solid renal masses. The unopacified ureters are normal in course and caliber. Urinary bladder is partially distended and unremarkable.  Normal adrenal glands. STOMACH/BOWEL: The stomach, small and large bowel are normal in course and caliber without inflammatory changes. Liquid stool in the rectosigmoid may represent diarrheal disease. Normal appendix. VASCULAR/LYMPHATIC: Aortoiliac vessels are normal in course and caliber. Atherosclerotic calcifications are seen in the right hemipelvis along the internal iliac artery. These calcifications are not felt to represent distal ureteral calculi given lack of more proximal ureteral dilatation. No lymphadenopathy by CT size criteria. REPRODUCTIVE: Unremarkable prostate. OTHER: No intraperitoneal free fluid or free air. MUSCULOSKELETAL: Concentric moderate broad-based posterior disc bulge L5-S1. No acute osseous abnormality. IMPRESSION: 1. No genitourinary calculus nor obstructive uropathy. 2. Moderate broad-based disc bulge L5-S1. 3. Fluid-filled rectosigmoid query diarrheal disease. No bowel obstruction or inflammation. Electronically Signed   By: Tollie Ethavid  Kwon M.D.   On: 08/13/2017 23:12    Procedures Procedures (including critical care time)  Medications Ordered in ED Medications  sodium chloride 0.9 % bolus 1,000 mL (0 mLs  Intravenous Stopped 08/13/17 2204)  ketorolac (TORADOL) 30 MG/ML injection 30 mg (30 mg Intravenous Given 08/13/17 2017)  ondansetron (ZOFRAN) injection 4 mg (4 mg Intravenous Given 08/13/17 2017)  sodium chloride 0.9 % bolus 1,000 mL (1,000 mLs Intravenous New Bag/Given 08/13/17 2300)     Initial Impression / Assessment and Plan / ED Course  I have reviewed the triage vital signs and the nursing notes.  Pertinent labs & imaging results that were available during my care of the patient were reviewed by me and considered in my medical decision making (see chart for details).  The patient presented to the emergency room for a near syncopal episode.  Patient had been having some trouble with low back pain.  No seizure.  Patient denies any head injury.  No chest pain or  shortness of breath.  Patient's laboratory tests are notable for acute kidney injury.  CT scan does not demonstrate kidney stones or acute vascular emergency.  Treated with IV fluids in the ED.  Plan on admission for further treatment  Final Clinical Impressions(s) / ED Diagnoses   Final diagnoses:  Acute kidney injury (HCC)  Syncope, unspecified syncope type  Dehydration  Bulging lumbar disc    ED Discharge Orders    None       Linwood Dibbles, MD 08/13/17 2339

## 2017-08-13 NOTE — ED Notes (Signed)
Pt to CT scan.

## 2017-08-13 NOTE — ED Triage Notes (Addendum)
Pt here via [facility] after a call for syncope this evening. Pt states that he was cutting a large tree today with a chainsaw and started having lower back pain. States that he went inside and sat down and started feeling bad, went to the restroom and got dizzy and fell from the toilet. Denies hitting head. EMS States that they found him slumped over and diaphoretic and slowly coming around. Was able to sit and stand after a little while. CBG 109. bolus given by EMS. VSS. Pt A & O x 4. States that he is having some abd cramping and thinks he may be dehydrated.

## 2017-08-14 ENCOUNTER — Encounter (HOSPITAL_COMMUNITY): Payer: Self-pay | Admitting: Internal Medicine

## 2017-08-14 ENCOUNTER — Other Ambulatory Visit: Payer: Self-pay

## 2017-08-14 ENCOUNTER — Inpatient Hospital Stay (HOSPITAL_COMMUNITY): Payer: Self-pay

## 2017-08-14 DIAGNOSIS — I361 Nonrheumatic tricuspid (valve) insufficiency: Secondary | ICD-10-CM

## 2017-08-14 DIAGNOSIS — R55 Syncope and collapse: Secondary | ICD-10-CM

## 2017-08-14 DIAGNOSIS — N179 Acute kidney failure, unspecified: Principal | ICD-10-CM

## 2017-08-14 LAB — URINALYSIS, ROUTINE W REFLEX MICROSCOPIC
BACTERIA UA: NONE SEEN
Bilirubin Urine: NEGATIVE
Glucose, UA: NEGATIVE mg/dL
Hgb urine dipstick: NEGATIVE
KETONES UR: 20 mg/dL — AB
Leukocytes, UA: NEGATIVE
Nitrite: NEGATIVE
PH: 5 (ref 5.0–8.0)
Protein, ur: 30 mg/dL — AB
Specific Gravity, Urine: 1.019 (ref 1.005–1.030)

## 2017-08-14 LAB — ECHOCARDIOGRAM COMPLETE
AOASC: 34 cm
E decel time: 296 msec
FS: 35 % (ref 28–44)
HEIGHTINCHES: 79 in
IV/PV OW: 0.92
LA vol A4C: 67.1 ml
LA vol index: 32.2 mL/m2
LA vol: 78.9 mL
LADIAMINDEX: 1.55 cm/m2
LASIZE: 38 mm
LEFT ATRIUM END SYS DIAM: 38 mm
LV PW d: 12 mm — AB (ref 0.6–1.1)
LV e' LATERAL: 10.9 cm/s
LVOT area: 3.8 cm2
LVOTD: 22 mm
Lateral S' vel: 18.3 cm/s
MV Dec: 296
MVPKEVEL: 0.9 m/s
RV sys press: 34 mmHg
Reg peak vel: 277 cm/s
TAPSE: 29.6 mm
TDI e' lateral: 10.9
TDI e' medial: 8.49
TR max vel: 277 cm/s
WEIGHTICAEL: 3770.75 [oz_av]

## 2017-08-14 LAB — BASIC METABOLIC PANEL
Anion gap: 7 (ref 5–15)
BUN: 7 mg/dL (ref 6–20)
CHLORIDE: 106 mmol/L (ref 101–111)
CO2: 24 mmol/L (ref 22–32)
CREATININE: 1.34 mg/dL — AB (ref 0.61–1.24)
Calcium: 8.1 mg/dL — ABNORMAL LOW (ref 8.9–10.3)
GFR calc Af Amer: >60 mL/min (ref >60)
GFR calc non Af Amer: 59 mL/min — ABNORMAL LOW (ref >60)
GLUCOSE: 89 mg/dL (ref 65–99)
POTASSIUM: 4 mmol/L (ref 3.5–5.1)
SODIUM: 137 mmol/L (ref 135–145)

## 2017-08-14 LAB — TSH: TSH: 1.24 u[IU]/mL (ref 0.350–4.500)

## 2017-08-14 LAB — CK TOTAL AND CKMB (NOT AT ARMC)
CK, MB: 14.5 ng/mL — AB (ref 0.5–5.0)
RELATIVE INDEX: 2.2 (ref 0.0–2.5)
Total CK: 645 U/L — ABNORMAL HIGH (ref 49–397)

## 2017-08-14 LAB — HIV ANTIBODY (ROUTINE TESTING W REFLEX): HIV Screen 4th Generation wRfx: NONREACTIVE

## 2017-08-14 LAB — TROPONIN I: Troponin I: <0.03 ng/mL (ref <0.03)

## 2017-08-14 MED ORDER — SACCHAROMYCES BOULARDII 250 MG PO CAPS
250.0000 mg | ORAL_CAPSULE | Freq: Two times a day (BID) | ORAL | Status: DC
  Administered 2017-08-14: 250 mg via ORAL
  Filled 2017-08-14 (×2): qty 1

## 2017-08-14 MED ORDER — AMLODIPINE BESYLATE 10 MG PO TABS
10.0000 mg | ORAL_TABLET | Freq: Every day | ORAL | Status: DC
  Filled 2017-08-14: qty 1

## 2017-08-14 MED ORDER — ENOXAPARIN SODIUM 40 MG/0.4ML ~~LOC~~ SOLN
40.0000 mg | Freq: Every day | SUBCUTANEOUS | Status: DC
  Administered 2017-08-14: 40 mg via SUBCUTANEOUS
  Filled 2017-08-14: qty 0.4

## 2017-08-14 MED ORDER — ATORVASTATIN CALCIUM 20 MG PO TABS
20.0000 mg | ORAL_TABLET | Freq: Every day | ORAL | Status: DC
  Administered 2017-08-14: 20 mg via ORAL
  Filled 2017-08-14 (×2): qty 1

## 2017-08-14 MED ORDER — SODIUM CHLORIDE 0.9 % IV SOLN
INTRAVENOUS | Status: AC
  Administered 2017-08-14: 01:00:00 via INTRAVENOUS

## 2017-08-14 MED ORDER — DICLOFENAC SODIUM 1 % TD GEL
4.0000 g | Freq: Three times a day (TID) | TRANSDERMAL | Status: DC
  Filled 2017-08-14: qty 100

## 2017-08-14 MED ORDER — ACETAMINOPHEN 325 MG PO TABS
650.0000 mg | ORAL_TABLET | Freq: Four times a day (QID) | ORAL | Status: DC | PRN
  Administered 2017-08-14: 650 mg via ORAL
  Filled 2017-08-14: qty 2

## 2017-08-14 MED ORDER — ONDANSETRON HCL 4 MG/2ML IJ SOLN: 4.0000 mg | Freq: Four times a day (QID) | INTRAMUSCULAR | Status: DC | PRN

## 2017-08-14 NOTE — Progress Notes (Signed)
[facility] PLAN OF CARE NOTE Patient: Phillip Oliver ZOX:[id number]   PCP: Jaclyn ShaggyAmao, Enobong, MD DOB: 08/27/1966   DOA: 08/13/2017   DOS: 08/14/2017    Patient was admitted by my colleague Dr. Selena BattenKim earlier on 08/14/2017. I have reviewed the H&P as well as assessment and plan and agree with the same. Important changes in the plan are listed below.  Plan of care: Principal Problem:   Syncope Active Problems:   ARF (acute renal failure) (HCC) renal function better, continue IVF If still has diarrhea need to rule out C.diff before giving imodium.   Author: Lynden OxfordPranav Patel, MD [facility] Pager: [phone] 08/14/2017 4:24 PM   If 7PM-7AM, please contact night-coverage at [email or web address], password [email or web address]

## 2017-08-14 NOTE — ED Notes (Signed)
Dr. Kim at bedside   

## 2017-08-14 NOTE — Progress Notes (Signed)
Bilateral carotid duplex completed. 1% to 39% ICA stenosis. Vertebral artery flow is antegrade. [place] Steaven Wholey,RVS 08/14/2017 11:42 AM

## 2017-08-14 NOTE — Progress Notes (Signed)
  Echocardiogram 2D Echocardiogram has been performed.  Dorena Dewiffany G Remmy Riffe 08/14/2017, 9:46 AM

## 2017-08-14 NOTE — Progress Notes (Signed)
New Admission Note:  Arrival Method: Stretcher  Mental Orientation:Alert and oriented x 4 Telemetry: Box 04 NSR  Assessment: Completed Skin: warm dry,  IV: NSL Pain: Denies  Tubes: N/A Safety Measures: Safety Fall Prevention Plan initiated.  Admission: Completed 5 M Orientation: Patient has been orientated to the room, unit and the staff. Family:  Orders have been reviewed and implemented. Will continue to monitor the patient. Call light has been placed within reach and bed alarm has been activated.   [place] ShiEmmanuel Zaahir Pickney BSN, RN  Phone Number: [phone]

## 2017-08-14 NOTE — ED Notes (Signed)
Patient denies pain and is resting comfortably.  

## 2017-08-14 NOTE — ED Notes (Signed)
Dr. Selena BattenKim still at bedside.

## 2017-08-14 NOTE — H&P (Signed)
TRH H&P   Patient Demographics:    Phillip Oliver, is a 51 y.o. male  MRN: [id number]   DOB - 20-Feb-1966  Admit Date - 08/13/2017  Outpatient Primary MD for the patient is Jaclyn Shaggy, MD  Referring MD/NP/PA: Iantha Fallen  Outpatient Specialists:   Patient coming from: home  Chief Complaint  Patient presents with  . Near Syncope      HPI:    Phillip Oliver  is a 51 y.o. male, w hypertension, hyperlipidemia, apparently had been working cutting wood and went inside and to the bathroom and had syncope while in the bathroom.  Pt thinks just a few minutes.  Pt denies postical confusion or tongue laceration.  Pt admits to diarrhea today.   In ED   CT Abd/ pelvis IMPRESSION: 1. No genitourinary calculus nor obstructive uropathy. 2. Moderate broad-based disc bulge L5-S1. 3. Fluid-filled rectosigmoid query diarrheal disease. No bowel obstruction or inflammation.   Wbc 6.8, Hgb 14.7 Plt 187 Urinalysis pending Na 134, K 4.6,  Bun 12, Creatinine 2.27  Pt will be admitted for syncope, and ARF   Review of systems:    In addition to the HPI above,   diarrhea No Fever-chills, No Headache, No changes with Vision or hearing, No problems swallowing food or Liquids, No Chest pain, Cough or Shortness of Breath, No Abdominal pain, No Nausea or Vommitting,  No Blood in stool or Urine, No dysuria, No new skin rashes or bruises, No new joints pains-aches,  No new weakness, tingling, numbness in any extremity, No recent weight gain or loss, No polyuria, polydypsia or polyphagia, No significant Mental Stressors.  A full 10 point Review of Systems was done, except as stated above, all other Review of Systems were negative.   With Past History of the following :    Past Medical History:  Diagnosis Date  . Hyperlipidemia   . Hypertension       History reviewed. No  pertinent surgical history.    Social History:     Social History   Tobacco Use  . Smoking status: Current Every Day Smoker    Packs/day: 0.25    Types: Cigarettes  . Smokeless tobacco: Current User  Substance Use Topics  . Alcohol use: Yes    Alcohol/week: 2.4 - 3.6 oz    Types: 3 - 4 Cans of beer, 1 - 2 Shots of liquor per week    Comment: 6 times weekly     Lives - at home  Mobility - walks by self  Family History :     Family History  Problem Relation Age of Onset  . Diabetes Mother       Home Medications:   Prior to Admission medications   Medication Sig Start Date End Date Taking? Authorizing Provider  amLODipine (NORVASC) 10 MG tablet Take 1 tablet (10 mg total) daily by mouth. 07/23/17  Yes Jaclyn ShaggyAmao, Enobong, MD  atorvastatin (LIPITOR) 20 MG tablet Take 1 tablet (20 mg total) daily by mouth. 07/23/17  Yes Amao, Enobong, MD  diclofenac sodium (VOLTAREN) 1 % GEL Apply 4 g 4 (four) times daily topically. 07/23/17  Yes Jaclyn ShaggyAmao, Enobong, MD  hydrochlorothiazide (HYDRODIURIL) 25 MG tablet Take 1 tablet (25 mg total) daily by mouth. 07/23/17  Yes Amao, Enobong, MD  lisinopril (PRINIVIL,ZESTRIL) 40 MG tablet Take 1 tablet (40 mg total) daily by mouth. 07/23/17  Yes Amao, Enobong, MD  sildenafil (VIAGRA) 50 MG tablet Take 1 tablet (50 mg total) by mouth daily as needed for erectile dysfunction. At least 24 hours between doses 05/27/17  Yes Jaclyn ShaggyAmao, Enobong, MD     Allergies:     Allergies  Allergen Reactions  . Tylenol [Acetaminophen] Other (See Comments)    Patient states that his mother told him this "years ago" as a child     Physical Exam:   Vitals  Blood pressure (!) 92/55, pulse 75, temperature (!) 96.5 F (35.8 C), temperature source Rectal, resp. rate 17, SpO2 97 %.   1. General lying in bed in NAD,   2. Normal affect and insight, Not Suicidal or Homicidal, Awake Alert, Oriented X 3.  3. No F.N deficits, ALL C.Nerves Intact, Strength 5/5 all 4 extremities,  Sensation intact all 4 extremities, Plantars down going.  4. Ears and Eyes appear Normal, Conjunctivae clear, PERRLA. Moist Oral Mucosa.  5. Supple Neck, No JVD, No cervical lymphadenopathy appriciated, No Carotid Bruits.  6. Symmetrical Chest wall movement, Good air movement bilaterally, CTAB.  7. RRR, No Gallops, Rubs or Murmurs, No Parasternal Heave.  8. Positive Bowel Sounds, Abdomen Soft, No tenderness, No organomegaly appriciated,No rebound -guarding or rigidity.  9.  No Cyanosis, Normal Skin Turgor, No Skin Rash or Bruise.  10. Good muscle tone,  joints appear normal , no effusions, Normal ROM.  11. No Palpable Lymph Nodes in Neck or Axillae     Data Review:    CBC Recent Labs  Lab 08/13/17 1952  WBC 6.8  HGB 14.7  HCT 44.1  PLT 187  MCV 92.1  MCH 30.7  MCHC 33.3  RDW 14.3   ------------------------------------------------------------------------------------------------------------------  Chemistries  Recent Labs  Lab 08/13/17 2010  NA 134*  K 4.6  CL 103  CO2 21*  GLUCOSE 86  BUN 12  CREATININE 2.27*  CALCIUM 9.1  AST 34  ALT 17  ALKPHOS 69  BILITOT 0.9   ------------------------------------------------------------------------------------------------------------------ CrCl cannot be calculated (Unknown ideal weight.). ------------------------------------------------------------------------------------------------------------------ No results for input(s): TSH, T4TOTAL, T3FREE, THYROIDAB in the last 72 hours.  Invalid input(s): FREET3  Coagulation profile No results for input(s): INR, PROTIME in the last 168 hours. ------------------------------------------------------------------------------------------------------------------- No results for input(s): DDIMER in the last 72 hours. -------------------------------------------------------------------------------------------------------------------  Cardiac Enzymes No results for input(s): CKMB,  TROPONINI, MYOGLOBIN in the last 168 hours.  Invalid input(s): CK ------------------------------------------------------------------------------------------------------------------ No results found for: BNP   ---------------------------------------------------------------------------------------------------------------  Urinalysis    Component Value Date/Time   COLORURINE AMBER (A) 08/22/2016 0610   APPEARANCEUR HAZY (A) 08/22/2016 0610   LABSPEC 1.032 (H) 08/22/2016 0610   PHURINE 5.0 08/22/2016 0610   GLUCOSEU NEGATIVE 08/22/2016 0610   HGBUR SMALL (A) 08/22/2016 0610   BILIRUBINUR NEGATIVE 08/22/2016 0610   KETONESUR 80 (A) 08/22/2016 0610   PROTEINUR 100 (A) 08/22/2016 0610   NITRITE NEGATIVE 08/22/2016 0610   LEUKOCYTESUR NEGATIVE 08/22/2016 0610    ----------------------------------------------------------------------------------------------------------------   Imaging Results:    Ct Abdomen Pelvis  Wo Contrast  Result Date: 08/13/2017 CLINICAL DATA:  Bilateral flank pain since this morning EXAM: CT ABDOMEN AND PELVIS WITHOUT CONTRAST TECHNIQUE: Multidetector CT imaging of the abdomen and pelvis was performed following the standard protocol without IV contrast. COMPARISON:  None. FINDINGS: LOWER CHEST: Lung bases are clear. Included heart size is normal. No pericardial effusion. HEPATOBILIARY: Liver and gallbladder are normal. PANCREAS: Normal. SPLEEN: Normal. ADRENALS/URINARY TRACT: Kidneys are orthotopic. No nephrolithiasis, hydronephrosis or solid renal masses. The unopacified ureters are normal in course and caliber. Urinary bladder is partially distended and unremarkable. Normal adrenal glands. STOMACH/BOWEL: The stomach, small and large bowel are normal in course and caliber without inflammatory changes. Liquid stool in the rectosigmoid may represent diarrheal disease. Normal appendix. VASCULAR/LYMPHATIC: Aortoiliac vessels are normal in course and caliber. Atherosclerotic  calcifications are seen in the right hemipelvis along the internal iliac artery. These calcifications are not felt to represent distal ureteral calculi given lack of more proximal ureteral dilatation. No lymphadenopathy by CT size criteria. REPRODUCTIVE: Unremarkable prostate. OTHER: No intraperitoneal free fluid or free air. MUSCULOSKELETAL: Concentric moderate broad-based posterior disc bulge L5-S1. No acute osseous abnormality. IMPRESSION: 1. No genitourinary calculus nor obstructive uropathy. 2. Moderate broad-based disc bulge L5-S1. 3. Fluid-filled rectosigmoid query diarrheal disease. No bowel obstruction or inflammation. Electronically Signed   By: Tollie Ethavid  Kwon M.D.   On: 08/13/2017 23:12   nsr at 60, nl axis, t inversion in v1, v2   Assessment & Plan:    Principal Problem:   Syncope Active Problems:   ARF (acute renal failure) (HCC)    Syncope Tele Orthostatic bp Trop I q6h x3 Carotid ultrasound Echo   ARF Check urine sodium, urine creatinine, urine eosinophils STOP lisinopril STOP hydrochlorothiazide Ns iv   Hypertension See above Cont amlodipine  Hyperlipidemia Cont lipitor,check CPK    DVT Prophylaxis  Lovenox - SCDs   AM Labs Ordered, also please review Full Orders  Family Communication: Admission, patients condition and plan of care including tests being ordered have been discussed with the patient  who indicate understanding and agree with the plan and Code Status.  Code Status FULL CODE  Likely DC to  \home  Condition GUARDED    Consults called: none   Admission status: inpatient  Time spent in minutes : 45   Pearson GrippeJames Correll Denbow M.D on 08/14/2017 at 12:27 AM  Between 7am to 7pm - Pager - [phone] . After 7pm go to [email or web address] - password [email or web address]  [facility] - Office  [phone]

## 2017-08-14 NOTE — Progress Notes (Signed)
Patient's room smelled like cigarette smoke. I asked the patient if he'd been smoking. Patient denied. Claimed his wife and friend came to visit and they smoked before they came into the hospital. Informed the patient that there's no smoking in this hospital.

## 2017-08-15 DIAGNOSIS — R55 Syncope and collapse: Secondary | ICD-10-CM

## 2017-08-15 LAB — CBC
HCT: 37.2 % — ABNORMAL LOW (ref 39.0–52.0)
HEMOGLOBIN: 12.3 g/dL — AB (ref 13.0–17.0)
MCH: 30.2 pg (ref 26.0–34.0)
MCHC: 33.1 g/dL (ref 30.0–36.0)
MCV: 91.4 fL (ref 78.0–100.0)
Platelets: 174 10*3/uL (ref 150–400)
RBC: 4.07 MIL/uL — ABNORMAL LOW (ref 4.22–5.81)
RDW: 14.3 % (ref 11.5–15.5)
WBC: 3.9 10*3/uL — ABNORMAL LOW (ref 4.0–10.5)

## 2017-08-15 LAB — BASIC METABOLIC PANEL
ANION GAP: 6 (ref 5–15)
BUN: 6 mg/dL (ref 6–20)
CALCIUM: 7.8 mg/dL — AB (ref 8.9–10.3)
CO2: 24 mmol/L (ref 22–32)
Chloride: 108 mmol/L (ref 101–111)
Creatinine, Ser: 1.01 mg/dL (ref 0.61–1.24)
GFR calc Af Amer: >60 mL/min (ref >60)
GLUCOSE: 99 mg/dL (ref 65–99)
Potassium: 4 mmol/L (ref 3.5–5.1)
Sodium: 138 mmol/L (ref 135–145)

## 2017-08-15 LAB — MAGNESIUM: MAGNESIUM: 2.2 mg/dL (ref 1.7–2.4)

## 2017-08-15 MED ORDER — SACCHAROMYCES BOULARDII 250 MG PO CAPS: 250.0000 mg | ORAL_CAPSULE | Freq: Two times a day (BID) | ORAL | 0 refills | Status: DC

## 2017-08-15 MED ORDER — HYDRALAZINE HCL 25 MG PO TABS: 25.0000 mg | ORAL_TABLET | Freq: Two times a day (BID) | ORAL | 0 refills | Status: DC

## 2017-08-15 MED ORDER — HYDRALAZINE HCL 25 MG PO TABS
25.0000 mg | ORAL_TABLET | Freq: Two times a day (BID) | ORAL | Status: DC
  Filled 2017-08-15: qty 1

## 2017-08-15 NOTE — Progress Notes (Signed)
Pt HR in 40's sinus brady unsustained, then comes back up to NSR 60's. Also noted HR in 40's with P wave without a QRS. Pt resting comfortably in bed. No complaints. VSS 98.1 62-14-115/87. O2 sat. 99% on RA. Pt asymptomatic. Daphane ShepherdM. Lynch, NP notified. Will continue to monitor.

## 2017-08-15 NOTE — Progress Notes (Signed)
Patient discharged home per MD. Discharge instructions reviewed with patient. Patient verbalized understanding [facility], and to f/u with PCP and cardiology next week. Patient stated that he has a bp cuff, and is able to monitor his bp. Patient refused wheelchair at discharge and opted to ambulate. Bess KindsGWALTNEY, Shaneen Reeser B, RN

## 2017-08-19 NOTE — Discharge Summary (Signed)
[facility] Discharge Summary   Patient: Phillip Oliver EAV:[id number]   PCP: Jaclyn Shaggy, MD DOB: 01-24-1966   Date of admission: 08/13/2017   Date of discharge: 08/15/2017     Discharge Diagnoses:  Principal Problem:   Syncope Active Problems:   ARF (acute renal failure) (HCC)   Admitted From: home Disposition:  home  Recommendations for Outpatient Follow-up:  1. Please follow-up with PCP in 1 week  Follow-up Information    Orpah Cobb, MD. Call on 08/17/2017.   Specialty:  Cardiology Why:  FOR APPOINTMENT ON [facility]  Contact information: [address] [place] [address] [phone]        Jaclyn Shaggy, MD. Schedule an appointment as soon as possible for a visit in 1 week(s).   Specialty:  Family Medicine Contact information: [address] [place] [address] [phone]          Diet recommendation: Cardiac diet  Activity: The patient is advised to gradually reintroduce usual activities.  Discharge Condition: good  Code Status: Full code  History of present illness: As per the H and P dictated on admission, "Phillip Oliver  is a 51 y.o. male, w hypertension, hyperlipidemia, apparently had been working cutting wood and went inside and to the bathroom and had syncope while in the bathroom.  Pt thinks just a few minutes.  Pt denies postical confusion or tongue laceration.  Pt admits to diarrhea today. "  Hospital Course:  Summary of his active problems in the hospital is as following. 1) syncope. More likely vasovagal. Presented with multiple episodes of diarrhea and dehydration. Received IV fluids and then orthostatics were negative. Symptoms totally resolved at the time of the discharge. We will continue to hold blood pressure medications on discharge as well.  2.  Acute kidney injury. Renal function worsened on admission, most likely due to dehydration from the diarrhea. Improved with IV hydration. Dehydration also  resolved. Patient was seen and doing well as well. No acute complaint. Holding lisinopril and Lasix on discharge.  3.  Diarrhea. Etiology not clear although resolved in the hospital. Monitor as an outpatient.  4.  Second-degree heart block. Presented with syncope, monitor on telemetry. There was one second-degree heart block noted on telemetry. Patient was sleeping at the time and was asymptomatic. Discussed with cardiology, Dr Algie Coffer recommend outpatient follow-up since the event happened when the patient was sleeping and was not symptomatic.  5.  Essential hypertension. Holding prior antihypertensive medications on discharge.  Blood pressure relatively stable. Switching the patient to hydralazine which will also help causing reflex tachycardia along with controlling high blood pressure.  All other chronic medical condition were stable during the hospitalization.  Patient was ambulatory without any assistance. On the day of the discharge the patient's vitals were stable, and no other acute medical condition were reported by patient. the patient was felt safe to be discharge at home with family.  Procedures and Results:  Echocardiogram    Consultations:  none  DISCHARGE MEDICATION: Allergies as of 08/15/2017      Reactions   Tylenol [acetaminophen] Other (See Comments)   Patient states that his mother told him this "years ago" as a child      Medication List    STOP taking these medications   amLODipine 10 MG tablet Commonly known as:  NORVASC   hydrochlorothiazide 25 MG tablet Commonly known as:  HYDRODIURIL   lisinopril 40 MG tablet Commonly known as:  PRINIVIL,ZESTRIL     TAKE these medications  atorvastatin 20 MG tablet Commonly known as:  LIPITOR Take 1 tablet (20 mg total) daily by mouth.   diclofenac sodium 1 % Gel Commonly known as:  VOLTAREN Apply 4 g 4 (four) times daily topically.   hydrALAZINE 25 MG tablet Commonly known as:  APRESOLINE Take  1 tablet (25 mg total) by mouth 2 (two) times daily.   saccharomyces boulardii 250 MG capsule Commonly known as:  FLORASTOR Take 1 capsule (250 mg total) by mouth 2 (two) times daily.   sildenafil 50 MG tablet Commonly known as:  VIAGRA Take 1 tablet (50 mg total) by mouth daily as needed for erectile dysfunction. At least 24 hours between doses      Allergies  Allergen Reactions  . Tylenol [Acetaminophen] Other (See Comments)    Patient states that his mother told him this "years ago" as a child   Discharge Instructions    Diet - low sodium heart healthy   Complete by:  As directed    Discharge instructions   Complete by:  As directed    It is important that you read following instructions as well as go over your medication list with RN to help you understand your care after this hospitalization.  Discharge Instructions: Please follow-up with PCP in one week  Please request your primary care physician to go over all Hospital Tests and Procedure/Radiological results at the follow up,  Please get all Hospital records sent to your PCP by signing hospital release before you go home.   Do not take more than prescribed Pain, Sleep and Anxiety Medications. You were cared for by a hospitalist during your hospital stay. If you have any questions about your discharge medications or the care you received while you were in the hospital after you are discharged, you can call the unit and ask to speak with the hospitalist on call if the hospitalist that took care of you is not available.  Once you are discharged, your primary care physician will handle any further medical issues. Please note that NO REFILLS for any discharge medications will be authorized once you are discharged, as it is imperative that you return to your primary care physician (or establish a relationship with a primary care physician if you do not have one) for your aftercare needs so that they can reassess your need for  medications and monitor your lab values. You Must read complete instructions/literature along with all the possible adverse reactions/side effects for all the Medicines you take and that have been prescribed to you. Take any new Medicines after you have completely understood and accept all the possible adverse reactions/side effects. Wear Seat belts while driving. If you have smoked or chewed Tobacco in the last 2 yrs please stop smoking and/or stop any Recreational drug use.   Increase activity slowly   Complete by:  As directed      Discharge Exam: Filed Weights   08/14/17 0113  Weight: 106.9 kg (235 lb 10.8 oz)   Vitals:   08/15/17 0432 08/15/17 0935  BP: 115/87 124/74  Pulse: 62 76  Resp: 14 16  Temp: 98.1 F (36.7 C) 98 F (36.7 C)  SpO2: 99% 100%   General: Appear in no distress, no Rash; Oral Mucosa moist. Cardiovascular: S1 and S2 Present, no Murmur, no JVD Respiratory: Bilateral Air entry present and Clear to Auscultation, no Crackles, no wheezes Abdomen: Bowel Sound present, Soft and no tenderness Extremities: no Pedal edema, no calf tenderness Neurology: Grossly no focal neuro  deficit.  The results of significant diagnostics from this hospitalization (including imaging, microbiology, ancillary and laboratory) are listed below for reference.    Significant Diagnostic Studies: Ct Abdomen Pelvis Wo Contrast  Result Date: 08/13/2017 CLINICAL DATA:  Bilateral flank pain since this morning EXAM: CT ABDOMEN AND PELVIS WITHOUT CONTRAST TECHNIQUE: Multidetector CT imaging of the abdomen and pelvis was performed following the standard protocol without IV contrast. COMPARISON:  None. FINDINGS: LOWER CHEST: Lung bases are clear. Included heart size is normal. No pericardial effusion. HEPATOBILIARY: Liver and gallbladder are normal. PANCREAS: Normal. SPLEEN: Normal. ADRENALS/URINARY TRACT: Kidneys are orthotopic. No nephrolithiasis, hydronephrosis or solid renal masses. The  unopacified ureters are normal in course and caliber. Urinary bladder is partially distended and unremarkable. Normal adrenal glands. STOMACH/BOWEL: The stomach, small and large bowel are normal in course and caliber without inflammatory changes. Liquid stool in the rectosigmoid may represent diarrheal disease. Normal appendix. VASCULAR/LYMPHATIC: Aortoiliac vessels are normal in course and caliber. Atherosclerotic calcifications are seen in the right hemipelvis along the internal iliac artery. These calcifications are not felt to represent distal ureteral calculi given lack of more proximal ureteral dilatation. No lymphadenopathy by CT size criteria. REPRODUCTIVE: Unremarkable prostate. OTHER: No intraperitoneal free fluid or free air. MUSCULOSKELETAL: Concentric moderate broad-based posterior disc bulge L5-S1. No acute osseous abnormality. IMPRESSION: 1. No genitourinary calculus nor obstructive uropathy. 2. Moderate broad-based disc bulge L5-S1. 3. Fluid-filled rectosigmoid query diarrheal disease. No bowel obstruction or inflammation. Electronically Signed   By: Tollie Ethavid  Kwon M.D.   On: 08/13/2017 23:12    Microbiology: No results found for this or any previous visit (from the past 240 hour(s)).   Labs: CBC: Recent Labs  Lab 08/13/17 1952 08/15/17 0501  WBC 6.8 3.9*  HGB 14.7 12.3*  HCT 44.1 37.2*  MCV 92.1 91.4  PLT 187 174   Basic Metabolic Panel: Recent Labs  Lab 08/13/17 2010 08/14/17 1333 08/15/17 0501  NA 134* 137 138  K 4.6 4.0 4.0  CL 103 106 108  CO2 21* 24 24  GLUCOSE 86 89 99  BUN 12 7 6   CREATININE 2.27* 1.34* 1.01  CALCIUM 9.1 8.1* 7.8*  MG  --   --  2.2   Liver Function Tests: Recent Labs  Lab 08/13/17 2010  AST 34  ALT 17  ALKPHOS 69  BILITOT 0.9  PROT 6.9  ALBUMIN 3.8   Recent Labs  Lab 08/13/17 2010  LIPASE 27   No results for input(s): AMMONIA in the last 168 hours. Cardiac Enzymes: Recent Labs  Lab 08/14/17 0047 08/14/17 0343 08/14/17 1333    CKTOTAL 645*  --   --   CKMB 14.5*  --   --   TROPONINI  --  <0.03 <0.03   BNP (last 3 results) No results for input(s): BNP in the last 8760 hours. CBG: No results for input(s): GLUCAP in the last 168 hours. Time spent: 35 minutes  Signed:  Lynden OxfordPranav Adra Shepler  [facility] 08/15/2017 , 6:28 PM

## 2017-10-07 MED FILL — LISINOPRIL 40 MG TAB: 40 | 30 days supply | Qty: 30 | Fill #1

## 2017-10-07 MED FILL — HYDROCHLOROTHIAZIDE 25 MG T: 25 | 30 days supply | Qty: 30 | Fill #1

## 2017-10-07 MED FILL — AMLODIPINE BESYLATE 10 MG T: 10 | 30 days supply | Qty: 30 | Fill #1

## 2017-10-07 MED FILL — ?ATORVASTATIN 20MG TABLET: 20 | 30 days supply | Qty: 30 | Fill #1

## 2017-10-07 MED FILL — !VIAGRA 50 MG TABLET: 50 MG | 30 days supply | Qty: 10 | Fill #1

## 2017-10-23 ENCOUNTER — Ambulatory Visit: Payer: Self-pay

## 2017-10-23 ENCOUNTER — Encounter: Payer: Self-pay | Admitting: Family Medicine

## 2017-10-23 ENCOUNTER — Ambulatory Visit: Payer: Self-pay | Attending: Family Medicine | Admitting: Family Medicine

## 2017-10-23 VITALS — BP 142/98 | HR 67 | Temp 98.0°F | Ht 79.0 in | Wt 244.6 lb

## 2017-10-23 DIAGNOSIS — E7849 Other hyperlipidemia: Secondary | ICD-10-CM | POA: Insufficient documentation

## 2017-10-23 DIAGNOSIS — Z79899 Other long term (current) drug therapy: Secondary | ICD-10-CM | POA: Insufficient documentation

## 2017-10-23 DIAGNOSIS — Z720 Tobacco use: Secondary | ICD-10-CM | POA: Insufficient documentation

## 2017-10-23 DIAGNOSIS — I10 Essential (primary) hypertension: Secondary | ICD-10-CM | POA: Insufficient documentation

## 2017-10-23 DIAGNOSIS — F17200 Nicotine dependence, unspecified, uncomplicated: Secondary | ICD-10-CM

## 2017-10-23 DIAGNOSIS — N529 Male erectile dysfunction, unspecified: Secondary | ICD-10-CM | POA: Insufficient documentation

## 2017-10-23 DIAGNOSIS — Z886 Allergy status to analgesic agent status: Secondary | ICD-10-CM | POA: Insufficient documentation

## 2017-10-23 DIAGNOSIS — I441 Atrioventricular block, second degree: Secondary | ICD-10-CM | POA: Insufficient documentation

## 2017-10-23 DIAGNOSIS — M170 Bilateral primary osteoarthritis of knee: Secondary | ICD-10-CM | POA: Insufficient documentation

## 2017-10-23 DIAGNOSIS — Z716 Tobacco abuse counseling: Secondary | ICD-10-CM | POA: Insufficient documentation

## 2017-10-23 MED ORDER — ACETAMINOPHEN-CODEINE #3 300-30 MG PO TABS: 1.0000 | ORAL_TABLET | Freq: Two times a day (BID) | ORAL | 0 refills | Status: DC | PRN

## 2017-10-23 MED ORDER — DICLOFENAC SODIUM 1 % TD GEL
4.0000 g | Freq: Four times a day (QID) | TRANSDERMAL | 1 refills | Status: DC
Start: 2017-10-23 — End: 2018-05-03

## 2017-10-23 MED ORDER — ATORVASTATIN CALCIUM 20 MG PO TABS: 20.0000 mg | ORAL_TABLET | Freq: Every day | ORAL | 5 refills | Status: DC

## 2017-10-23 MED ORDER — HYDRALAZINE HCL 25 MG PO TABS: 25.0000 mg | ORAL_TABLET | Freq: Two times a day (BID) | ORAL | 5 refills | Status: DC

## 2017-10-23 MED ORDER — ALBUTEROL SULFATE HFA 108 (90 BASE) MCG/ACT IN AERS
2.0000 | INHALATION_SPRAY | Freq: Four times a day (QID) | RESPIRATORY_TRACT | 1 refills | Status: DC | PRN
Start: 2017-10-23 — End: 2017-12-14

## 2017-10-23 MED FILL — ACETAMINOPHEN/COD #3 TABLET: 300-30 | 15 days supply | Qty: 30 | Fill #0

## 2017-10-23 MED FILL — DICLOFENAC SODIUM 1% GEL: 1 | 6 days supply | Qty: 100 | Fill #0

## 2017-10-23 MED FILL — !VENTOLIN HFA INHALER: 108 (90 BAS | 25 days supply | Qty: 18 | Fill #0

## 2017-10-23 MED FILL — hydrALAZINE HCL 25 MG TABS: 25 | 30 days supply | Qty: 60 | Fill #0

## 2017-10-23 NOTE — Progress Notes (Signed)
Subjective:  Patient ID: Phillip Oliver, male    DOB: 05/01/1966  Age: 52 y.o. MRN: [id number]  CC: Hypertension   HPI Phillip Oliver  is a 52 year old male with a history of hypertension, hyperlipidemia, erectile dysfunction here for a follow up visit.  He was hospitalized at [facility] from 08/13/17-08/15/18 for Syncope and acute renal failure after he had presented with a brief episode of syncope in the bathroom at home. He had admited to diarrhea at the time, creatinine had trended up to 2.27 from a baseline of 1.1. He was treated with IV fluids and antihypertensives were held. CT abdomen was negative for genitourinary calculus, obstructive uropathy,revealed moderate L5S1 broad based disc bulge, fluid filled rectosigmoid query diarrheal disease.  During hospitalization he had one episode of second degree heart block noted on telemetry which was asymptomatic and Cardiology referral recommended. He was subsequently discharged.  He reports doing well today and denies repeat syncopal episodes, chest pains or shortness of breath. He complains of bilateral knee pains which worsened after he tripped over a trash can and fell 2 weeks ago. Denies knee swelling; pain is described as moderate.  Past Medical History:  Diagnosis Date  . Hyperlipidemia   . Hypertension     History reviewed. No pertinent surgical history.  Allergies  Allergen Reactions  . Tylenol [Acetaminophen] Other (See Comments)    Patient states that his mother told him this "years ago" as a child     Outpatient Medications Prior to Visit  Medication Sig Dispense Refill  . saccharomyces boulardii (FLORASTOR) 250 MG capsule Take 1 capsule (250 mg total) by mouth 2 (two) times daily. 30 capsule 0  . sildenafil (VIAGRA) 50 MG tablet Take 1 tablet (50 mg total) by mouth daily as needed for erectile dysfunction. At least 24 hours between doses 30 tablet 1  . atorvastatin (LIPITOR) 20 MG tablet Take 1 tablet (20 mg  total) daily by mouth. 30 tablet 5  . diclofenac sodium (VOLTAREN) 1 % GEL Apply 4 g 4 (four) times daily topically. 100 g 1  . hydrALAZINE (APRESOLINE) 25 MG tablet Take 1 tablet (25 mg total) by mouth 2 (two) times daily. 60 tablet 0   No facility-administered medications prior to visit.     ROS Review of Systems  Constitutional: Negative for activity change and appetite change.  HENT: Negative for sinus pressure and sore throat.   Eyes: Negative for visual disturbance.  Respiratory: Negative for cough, chest tightness and shortness of breath.   Cardiovascular: Negative for chest pain and leg swelling.  Gastrointestinal: Negative for abdominal distention, abdominal pain, constipation and diarrhea.  Endocrine: Negative.   Genitourinary: Negative for dysuria.  Musculoskeletal:       See hpi  Skin: Negative for rash.  Allergic/Immunologic: Negative.   Neurological: Negative for weakness, light-headedness and numbness.  Psychiatric/Behavioral: Negative for dysphoric mood and suicidal ideas.    Objective:  BP (!) 142/98   Pulse 67   Temp 98 F (36.7 C) (Oral)   Ht 6' 7"  (2.007 m)   Wt 244 lb 9.6 oz (110.9 kg)   SpO2 98%   BMI 27.56 kg/m   BP/Weight 10/23/2017 08/15/2017 35/01/7321  Systolic BP 025 427 -  Diastolic BP 98 74 -  Wt. (Lbs) 244.6 - 235.67  BMI 27.56 - 26.55      Physical Exam  Constitutional: He is oriented to person, place, and time. He appears well-developed and well-nourished.  Cardiovascular: Normal rate, normal heart sounds and  intact distal pulses.  No murmur heard. Pulmonary/Chest: Effort normal and breath sounds normal. He has no wheezes. He has no rales. He exhibits no tenderness.  Abdominal: Soft. Bowel sounds are normal. He exhibits no distension and no mass. There is no tenderness.  Musculoskeletal: Normal range of motion.  Crepitus on ROM of both knees  Neurological: He is alert and oriented to person, place, and time.  Skin: Skin is warm.    Psychiatric: He has a normal mood and affect.     Lipid Panel     Component Value Date/Time   CHOL 144 06/23/2017 0852   TRIG 102 06/23/2017 0852   HDL 41 06/23/2017 0852   CHOLHDL 3.5 06/23/2017 0852   CHOLHDL 3.5 02/22/2016 0959   VLDL 25 02/22/2016 0959   LDLCALC 83 06/23/2017 0852    Assessment & Plan:   1. Other hyperlipidemia Controlled Low cholesterol diet - atorvastatin (LIPITOR) 20 MG tablet; Take 1 tablet (20 mg total) by mouth daily.  Dispense: 30 tablet; Refill: 5 - CMP14+EGFR  2. Tobacco use disorder Smoking cessation support: smoking cessation hotline: 1-800-QUIT-NOW.  Smoking cessation classes are available through [facility] and [facility]. Call [phone] or visit our website at [email or web address]  Spent 3 minutes counseling on dangers of tobacco use and benefits of quitting and patient is not ready to quit.   3. Primary osteoarthritis of both knees Uncontrolled Acute on chronic pain triggered by recent fall - diclofenac sodium (VOLTAREN) 1 % GEL; Apply 4 g topically 4 (four) times daily.  Dispense: 100 g; Refill: 1 - acetaminophen-codeine (TYLENOL #3) 300-30 MG tablet; Take 1 tablet by mouth every 12 (twelve) hours as needed for moderate pain.  Dispense: 30 tablet; Refill: 0  4. Second degree atrioventricular block EKG during hospitalization revealed 2nd degree AV block; will refer to Cardiology especially in the light of the syncope he presented with  - Ambulatory referral to Cardiology  5. Essential hypertension Controlled - hydrALAZINE (APRESOLINE) 25 MG tablet; Take 1 tablet (25 mg total) by mouth 2 (two) times daily.  Dispense: 60 tablet; Refill: 5   Meds ordered this encounter  Medications  . atorvastatin (LIPITOR) 20 MG tablet    Sig: Take 1 tablet (20 mg total) by mouth daily.    Dispense:  30 tablet    Refill:  5  . hydrALAZINE (APRESOLINE) 25 MG tablet    Sig: Take 1 tablet (25 mg total) by mouth 2 (two) times daily.     Dispense:  60 tablet    Refill:  5  . diclofenac sodium (VOLTAREN) 1 % GEL    Sig: Apply 4 g topically 4 (four) times daily.    Dispense:  100 g    Refill:  1  . acetaminophen-codeine (TYLENOL #3) 300-30 MG tablet    Sig: Take 1 tablet by mouth every 12 (twelve) hours as needed for moderate pain.    Dispense:  30 tablet    Refill:  0    Follow-up: Return in about 3 months (around 01/20/2018) for Follow up on chronic medical conditions.   Charlott Rakes MD

## 2017-10-23 NOTE — Patient Instructions (Signed)
Steps to Quit Smoking Smoking tobacco can be bad for your health. It can also affect almost every organ in your body. Smoking puts you and people around you at risk for many serious long-lasting (chronic) diseases. Quitting smoking is hard, but it is one of the best things that you can do for your health. It is never too late to quit. What are the benefits of quitting smoking? When you quit smoking, you lower your risk for getting serious diseases and conditions. They can include:  Lung cancer or lung disease.  Heart disease.  Stroke.  Heart attack.  Not being able to have children (infertility).  Weak bones (osteoporosis) and broken bones (fractures).  If you have coughing, wheezing, and shortness of breath, those symptoms may get better when you quit. You may also get sick less often. If you are pregnant, quitting smoking can help to lower your chances of having a baby of low birth weight. What can I do to help me quit smoking? Talk with your doctor about what can help you quit smoking. Some things you can do (strategies) include:  Quitting smoking totally, instead of slowly cutting back how much you smoke over a period of time.  Going to in-person counseling. You are more likely to quit if you go to many counseling sessions.  Using resources and support systems, such as: ? Online chats with a counselor. ? Phone quitlines. ? Printed self-help materials. ? Support groups or group counseling. ? Text messaging programs. ? Mobile phone apps or applications.  Taking medicines. Some of these medicines may have nicotine in them. If you are pregnant or breastfeeding, do not take any medicines to quit smoking unless your doctor says it is okay. Talk with your doctor about counseling or other things that can help you.  Talk with your doctor about using more than one strategy at the same time, such as taking medicines while you are also going to in-person counseling. This can help make  quitting easier. What things can I do to make it easier to quit? Quitting smoking might feel very hard at first, but there is a lot that you can do to make it easier. Take these steps:  Talk to your family and friends. Ask them to support and encourage you.  Call phone quitlines, reach out to support groups, or work with a counselor.  Ask people who smoke to not smoke around you.  Avoid places that make you want (trigger) to smoke, such as: ? Bars. ? Parties. ? Smoke-break areas at work.  Spend time with people who do not smoke.  Lower the stress in your life. Stress can make you want to smoke. Try these things to help your stress: ? Getting regular exercise. ? Deep-breathing exercises. ? Yoga. ? Meditating. ? Doing a body scan. To do this, close your eyes, focus on one area of your body at a time from head to toe, and notice which parts of your body are tense. Try to relax the muscles in those areas.  Download or buy apps on your mobile phone or tablet that can help you stick to your quit plan. There are many free apps, such as QuitGuide from the CDC (Centers for Disease Control and Prevention). You can find more support from smokefree.gov and other websites.  This information is not intended to replace advice given to you by your health care provider. Make sure you discuss any questions you have with your health care provider. Document Released: 06/21/2009 Document   Revised: 04/22/2016 Document Reviewed: 01/09/2015 Elsevier Interactive Patient Education  2018 Elsevier Inc.  

## 2017-10-24 LAB — CMP14+EGFR
A/G RATIO: 1.3 (ref 1.2–2.2)
ALT: 10 IU/L (ref 0–44)
AST: 14 IU/L (ref 0–40)
Albumin: 3.9 g/dL (ref 3.5–5.5)
Alkaline Phosphatase: 58 IU/L (ref 39–117)
BUN / CREAT RATIO: 6 — AB (ref 9–20)
BUN: 6 mg/dL (ref 6–24)
Bilirubin Total: 0.5 mg/dL (ref 0.0–1.2)
CALCIUM: 9.3 mg/dL (ref 8.7–10.2)
CO2: 25 mmol/L (ref 20–29)
CREATININE: 1.07 mg/dL (ref 0.76–1.27)
Chloride: 105 mmol/L (ref 96–106)
GFR calc Af Amer: 91 mL/min/{1.73_m2} (ref >59)
GFR, EST NON AFRICAN AMERICAN: 79 mL/min/{1.73_m2} (ref >59)
GLOBULIN, TOTAL: 3 g/dL (ref 1.5–4.5)
Glucose: 96 mg/dL (ref 65–99)
POTASSIUM: 4.4 mmol/L (ref 3.5–5.2)
SODIUM: 143 mmol/L (ref 134–144)
TOTAL PROTEIN: 6.9 g/dL (ref 6.0–8.5)

## 2017-10-29 ENCOUNTER — Telehealth: Payer: Self-pay

## 2017-10-29 NOTE — Telephone Encounter (Signed)
Patient was called and informed of lab results. 

## 2017-11-09 ENCOUNTER — Encounter (HOSPITAL_COMMUNITY): Payer: Self-pay | Admitting: Emergency Medicine

## 2017-11-09 ENCOUNTER — Emergency Department (HOSPITAL_COMMUNITY)
Admission: EM | Admit: 2017-11-09 | Discharge: 2017-11-09 | Disposition: A | Discharge status: Home / Self Care | Payer: Self-pay | Attending: Emergency Medicine | Admitting: Emergency Medicine

## 2017-11-09 ENCOUNTER — Other Ambulatory Visit: Payer: Self-pay

## 2017-11-09 DIAGNOSIS — R55 Syncope and collapse: Secondary | ICD-10-CM | POA: Insufficient documentation

## 2017-11-09 DIAGNOSIS — F17210 Nicotine dependence, cigarettes, uncomplicated: Secondary | ICD-10-CM | POA: Insufficient documentation

## 2017-11-09 DIAGNOSIS — I10 Essential (primary) hypertension: Secondary | ICD-10-CM | POA: Insufficient documentation

## 2017-11-09 DIAGNOSIS — Z79899 Other long term (current) drug therapy: Secondary | ICD-10-CM | POA: Insufficient documentation

## 2017-11-09 LAB — CBC
HCT: 45.8 % (ref 39.0–52.0)
Hemoglobin: 15.2 g/dL (ref 13.0–17.0)
MCH: 30.6 pg (ref 26.0–34.0)
MCHC: 33.2 g/dL (ref 30.0–36.0)
MCV: 92.2 fL (ref 78.0–100.0)
Platelets: 174 10*3/uL (ref 150–400)
RBC: 4.97 MIL/uL (ref 4.22–5.81)
RDW: 13.6 % (ref 11.5–15.5)
WBC: 3.4 10*3/uL — ABNORMAL LOW (ref 4.0–10.5)

## 2017-11-09 LAB — BASIC METABOLIC PANEL
Anion gap: 10 (ref 5–15)
BUN: 15 mg/dL (ref 6–20)
CO2: 23 mmol/L (ref 22–32)
Calcium: 7.9 mg/dL — ABNORMAL LOW (ref 8.9–10.3)
Chloride: 101 mmol/L (ref 101–111)
Creatinine, Ser: 1.78 mg/dL — ABNORMAL HIGH (ref 0.61–1.24)
GFR calc non Af Amer: 42 mL/min — ABNORMAL LOW (ref >60)
GFR, EST AFRICAN AMERICAN: 49 mL/min — AB (ref >60)
Glucose, Bld: 96 mg/dL (ref 65–99)
Potassium: 4.4 mmol/L (ref 3.5–5.1)
Sodium: 134 mmol/L — ABNORMAL LOW (ref 135–145)

## 2017-11-09 NOTE — ED Provider Notes (Signed)
[facility] EMERGENCY DEPARTMENT Provider Note   CSN: [id number] Arrival date & time: 11/09/17  1636     History   Chief Complaint Chief Complaint  Patient presents with  . Loss of Consciousness    HPI Jamarkis Branam is a 52 y.o. male.  HPI  52 year old male presents emergency department status post fall from seated position/loss of consciousness that was witnessed by family member after given plasma 1 hour prior.  Patient was at his home and was sitting drinking a beer.  Patient denies any Prior shortness of breath or chest pain to the event.  Currently patient is asymptomatic denies any recent illness or trauma.   Past Medical History:  Diagnosis Date  . Hyperlipidemia   . Hypertension     Patient Active Problem List   Diagnosis Date Noted  . Syncope 08/14/2017  . ARF (acute renal failure) (HCC) 08/13/2017  . Hyperlipidemia 02/25/2016  . Osteoarthritis 02/22/2016  . Erectile dysfunction 02/22/2016  . Essential hypertension 12/24/2015  . Tooth ache 12/24/2015  . Tobacco use disorder 12/24/2015    History reviewed. No pertinent surgical history.     Home Medications    Prior to Admission medications   Medication Sig Start Date End Date Taking? Authorizing Provider  acetaminophen-codeine (TYLENOL #3) 300-30 MG tablet Take 1 tablet by mouth every 12 (twelve) hours as needed for moderate pain. 10/23/17   Hoy Register, MD  albuterol (PROVENTIL HFA;VENTOLIN HFA) 108 (90 Base) MCG/ACT inhaler Inhale 2 puffs into the lungs every 6 (six) hours as needed for wheezing or shortness of breath. 10/23/17   Hoy Register, MD  atorvastatin (LIPITOR) 20 MG tablet Take 1 tablet (20 mg total) by mouth daily. 10/23/17   Hoy Register, MD  diclofenac sodium (VOLTAREN) 1 % GEL Apply 4 g topically 4 (four) times daily. 10/23/17   Hoy Register, MD  hydrALAZINE (APRESOLINE) 25 MG tablet Take 1 tablet (25 mg total) by mouth 2 (two) times daily. 10/23/17   Hoy Register, MD  saccharomyces boulardii (FLORASTOR) 250 MG capsule Take 1 capsule (250 mg total) by mouth 2 (two) times daily. 08/15/17   Rolly Salter, MD  sildenafil (VIAGRA) 50 MG tablet Take 1 tablet (50 mg total) by mouth daily as needed for erectile dysfunction. At least 24 hours between doses 05/27/17   Hoy Register, MD    Family History Family History  Problem Relation Age of Onset  . Diabetes Mother     Social History Social History   Tobacco Use  . Smoking status: Current Every Day Smoker    Packs/day: 0.25    Types: Cigarettes  . Smokeless tobacco: Current User  Substance Use Topics  . Alcohol use: Yes    Alcohol/week: 2.4 - 3.6 oz    Types: 3 - 4 Cans of beer, 1 - 2 Shots of liquor per week    Comment: 6 times weekly  . Drug use: No     Allergies   Tylenol [acetaminophen]   Review of Systems Review of Systems  Review of Systems  Constitutional: Negative for fever and chills.  HENT: Negative for ear pain, sore throat and trouble swallowing.   Eyes: Negative for pain and visual disturbance.  Respiratory: Negative for cough and shortness of breath.   Cardiovascular: Negative for chest pain and leg swelling.  Gastrointestinal: Negative for nausea, vomiting, abdominal pain and diarrhea.  Genitourinary: Negative for dysuria, urgency and frequency.  Musculoskeletal: Negative for back pain and joint swelling.  Skin: Negative  for rash and wound.  Neurological: Negative for dizziness, speech difficulty, weakness and numbness.+ syncope   Physical Exam Updated Vital Signs BP 99/73 (BP Location: Right Arm)   Pulse 73   Temp 97.7 F (36.5 C) (Oral)   Resp 18   Ht 6\' 7"  (2.007 m)   Wt 117 kg (258 lb)   SpO2 100%   BMI 29.06 kg/m   Physical Exam  Physical Exam Vitals:   11/09/17 1646 11/09/17 1750  BP: 92/70 99/73  Pulse: 80 73  Resp: 20 18  Temp: 97.7 F (36.5 C)   SpO2: 99% 100%   Constitutional: Patient is in no acute distress Head:  Normocephalic and atraumatic.  Eyes: Extraocular motion intact, no scleral icterus Neck: Supple without meningismus, mass, or overt JVD Respiratory: Effort normal and breath sounds normal. No respiratory distress. CV: Heart regular rate and rhythm, no obvious murmurs.  Pulses +2 and symmetric Abdomen: Soft, non-tender, non-distended MSK: Extremities are atraumatic without deformity, ROM intact Skin: Warm, dry, intact Neuro: Alert and oriented, no motor deficit noted Psychiatric: Mood and affect are normal.  ED Treatments / Results  Labs (all labs ordered are listed, but only abnormal results are displayed) Labs Reviewed  BASIC METABOLIC PANEL - Abnormal; Notable for the following components:      Result Value   Sodium 134 (*)    Creatinine, Ser 1.78 (*)    Calcium 7.9 (*)    GFR calc non Af Amer 42 (*)    GFR calc Af Amer 49 (*)    All other components within normal limits  CBC - Abnormal; Notable for the following components:   WBC 3.4 (*)    All other components within normal limits  URINALYSIS, ROUTINE W REFLEX MICROSCOPIC  CBG MONITORING, ED    EKG  EKG Interpretation  Date/Time:  Monday November 09 2017 16:29:13 EST Ventricular Rate:  80 PR Interval:  142 QRS Duration: 86 QT Interval:  420 QTC Calculation: 484 R Axis:   61 Text Interpretation:  Normal sinus rhythm Nonspecific ST and T wave abnormality Prolonged QT Abnormal ECG No significant change since last tracing Confirmed by Jacalyn Lefevre [phone]) on 11/09/2017 4:39:22 PM       Radiology No results found.  Procedures Procedures (including critical care time)  Medications Ordered in ED Medications - No data to display   Initial Impression / Assessment and Plan / ED Course  I have reviewed the triage vital signs and the nursing notes.  Pertinent labs & imaging results that were available during my care of the patient were reviewed by me and considered in my medical decision making (see chart for  details).     52 year old male presents emergency department status post fall from seated position/loss of consciousness that was witnessed by family member after given plasma 1 hour prior.  Patient was at his home and was sitting drinking a beer.  Patient denies any Prior shortness of breath or chest pain to the event.  Currently patient is asymptomatic denies any recent illness or trauma.  Patient arrives hemato-medically stable well-appearing.  Physical exam as annotated above patient is normotensive.  Review of labs shows no evidence of anemia.  Patient is not hypoglycemic.  No focal deficits on neuro exam.  [place] score 0 low risk for complications with syncope.  Plan for discharge home at patient's request as well.  EKG no findings concerning for ACS/arrhythmia.  Chest x-ray no evidence of acute pathology.  Final Clinical Impressions(s) /  ED Diagnoses   Final diagnoses:  Syncope, unspecified syncope type    ED Discharge Orders    None       Jaynie Collinsugustin, Easter Schinke, DO 11/09/17 1759    Jacalyn LefevreHaviland, Julie, MD 11/09/17 [phone]

## 2017-11-09 NOTE — ED Triage Notes (Signed)
[facility] arrived [facility] post syncopal episode. Patient gave plasma this morning, became diaphoretic, hypotensive and loss consciousness. Denies pain. Reports weakness. History of HTN, cholosterol.

## 2017-12-02 ENCOUNTER — Encounter: Payer: Self-pay | Admitting: Internal Medicine

## 2017-12-14 ENCOUNTER — Encounter (INDEPENDENT_AMBULATORY_CARE_PROVIDER_SITE_OTHER): Payer: Self-pay

## 2017-12-14 ENCOUNTER — Encounter: Payer: Self-pay | Admitting: Internal Medicine

## 2017-12-14 ENCOUNTER — Ambulatory Visit (INDEPENDENT_AMBULATORY_CARE_PROVIDER_SITE_OTHER): Payer: Self-pay | Admitting: Internal Medicine

## 2017-12-14 VITALS — BP 110/86 | HR 73 | Ht 79.0 in | Wt 245.5 lb

## 2017-12-14 DIAGNOSIS — F1910 Other psychoactive substance abuse, uncomplicated: Secondary | ICD-10-CM | POA: Insufficient documentation

## 2017-12-14 DIAGNOSIS — I10 Essential (primary) hypertension: Secondary | ICD-10-CM

## 2017-12-14 DIAGNOSIS — R55 Syncope and collapse: Secondary | ICD-10-CM

## 2017-12-14 NOTE — Patient Instructions (Addendum)
Medication Instructions:  Your physician recommends that you continue on your current medications as directed. Please refer to the Current Medication list given to you today.  -- If you need a refill on your cardiac medications before your next appointment, please call your pharmacy. --  Labwork: None ordered  Testing/Procedures: Your physician has recommended that you wear a 30 day event monitor. Event monitors are medical devices that record the heart's electrical activity. Doctors most often us these monitors to diagnose arrhythmias. Arrhythmias are problems with the speed or rhythm of the heartbeat. The monitor is a small, portable device. You can wear one while you do your normal daily activities. This is usually used to diagnose what is causing palpitations/syncope (passing out).   Follow-Up: Your physician wants you to follow-up in: 2 months with Dr. Okey DupreEnd.     Thank you for choosing [facility]!!    Any Other Special Instructions Will Be Listed Below (If Applicable).
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
## 2017-12-25 ENCOUNTER — Ambulatory Visit: Payer: Self-pay

## 2018-01-20 ENCOUNTER — Ambulatory Visit: Payer: Self-pay | Admitting: Family Medicine

## 2018-01-21 MED FILL — ATORVASTATIN 20 MG TABLET: 20 | 30 days supply | Qty: 30 | Fill #0

## 2018-01-21 MED FILL — HYDROCHLOROTHIAZIDE 25 MG T: 25 | 30 days supply | Qty: 30 | Fill #2

## 2018-01-21 MED FILL — AMLODIPINE BESYLATE 10 MG T: 10 | 30 days supply | Qty: 30 | Fill #2

## 2018-01-21 MED FILL — $Viagra 50mg tablet: 50 | 30 days supply | Qty: 10 | Fill #2

## 2018-01-21 MED FILL — hydrALAZINE HCL 25 MG TABS: 25 | 30 days supply | Qty: 60 | Fill #1

## 2018-01-21 MED FILL — LISINOPRIL 40 MG TABLET: 40 | 30 days supply | Qty: 30 | Fill #2

## 2018-01-22 ENCOUNTER — Ambulatory Visit: Payer: Self-pay | Attending: Family Medicine | Admitting: Family Medicine

## 2018-01-22 ENCOUNTER — Ambulatory Visit: Payer: Self-pay | Admitting: Family Medicine

## 2018-01-22 ENCOUNTER — Encounter: Payer: Self-pay | Admitting: Family Medicine

## 2018-01-22 VITALS — BP 119/87 | HR 76 | Temp 98.0°F | Ht 79.0 in | Wt 231.4 lb

## 2018-01-22 DIAGNOSIS — M62838 Other muscle spasm: Secondary | ICD-10-CM | POA: Insufficient documentation

## 2018-01-22 DIAGNOSIS — N529 Male erectile dysfunction, unspecified: Secondary | ICD-10-CM | POA: Insufficient documentation

## 2018-01-22 DIAGNOSIS — N179 Acute kidney failure, unspecified: Secondary | ICD-10-CM | POA: Insufficient documentation

## 2018-01-22 DIAGNOSIS — I10 Essential (primary) hypertension: Secondary | ICD-10-CM | POA: Insufficient documentation

## 2018-01-22 DIAGNOSIS — E7800 Pure hypercholesterolemia, unspecified: Secondary | ICD-10-CM | POA: Insufficient documentation

## 2018-01-22 DIAGNOSIS — R55 Syncope and collapse: Secondary | ICD-10-CM | POA: Insufficient documentation

## 2018-01-22 DIAGNOSIS — I441 Atrioventricular block, second degree: Secondary | ICD-10-CM | POA: Insufficient documentation

## 2018-01-22 DIAGNOSIS — Z79899 Other long term (current) drug therapy: Secondary | ICD-10-CM | POA: Insufficient documentation

## 2018-01-22 DIAGNOSIS — Z886 Allergy status to analgesic agent status: Secondary | ICD-10-CM | POA: Insufficient documentation

## 2018-01-22 MED ORDER — METHOCARBAMOL 500 MG PO TABS: 500.0000 mg | ORAL_TABLET | Freq: Three times a day (TID) | ORAL | 2 refills | Status: DC | PRN

## 2018-01-22 NOTE — Patient Instructions (Signed)
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
## 2018-01-23 LAB — BASIC METABOLIC PANEL
BUN / CREAT RATIO: 10 (ref 9–20)
BUN: 20 mg/dL (ref 6–24)
CO2: 17 mmol/L — ABNORMAL LOW (ref 20–29)
Calcium: 9.6 mg/dL (ref 8.7–10.2)
Chloride: 99 mmol/L (ref 96–106)
Creatinine, Ser: 2.03 mg/dL — ABNORMAL HIGH (ref 0.76–1.27)
GFR, EST AFRICAN AMERICAN: 42 mL/min/{1.73_m2} — AB (ref >59)
GFR, EST NON AFRICAN AMERICAN: 36 mL/min/{1.73_m2} — AB (ref >59)
GLUCOSE: 79 mg/dL (ref 65–99)
Potassium: 4.7 mmol/L (ref 3.5–5.2)
SODIUM: 135 mmol/L (ref 134–144)

## 2018-01-25 ENCOUNTER — Other Ambulatory Visit: Payer: Self-pay | Admitting: Family Medicine

## 2018-01-26 ENCOUNTER — Telehealth: Payer: Self-pay

## 2018-01-26 NOTE — Telephone Encounter (Signed)
-----   Message from Hoy Register, MD sent at 01/25/2018  9:21 AM EDT ----- Please advise him to discontinue hydrochlorothiazide as his kidney function shows further decline; also avoid ibuprofen and Aleve and other NSAIDs.  Thank you

## 2018-01-26 NOTE — Telephone Encounter (Signed)
Patient was called and informed of lab results and to stop taking HCTZ.

## 2018-03-05 ENCOUNTER — Ambulatory Visit: Payer: Self-pay | Admitting: Internal Medicine

## 2018-04-30 MED FILL — hydrALAZINE HCL 25 MG TABS: 25 | 30 days supply | Qty: 60 | Fill #2

## 2018-04-30 MED FILL — AMLODIPINE BESYLATE 10 MG T: 10 | 30 days supply | Qty: 30 | Fill #3

## 2018-04-30 MED FILL — $Viagra 50mg tablet: 50 | 30 days supply | Qty: 10 | Fill #3

## 2018-04-30 MED FILL — ATORVASTATIN 20 MG TABLET: 20 | 30 days supply | Qty: 30 | Fill #1

## 2018-05-03 ENCOUNTER — Encounter: Payer: Self-pay | Admitting: Family Medicine

## 2018-05-03 ENCOUNTER — Ambulatory Visit: Payer: Self-pay | Attending: Family Medicine | Admitting: Family Medicine

## 2018-05-03 VITALS — BP 154/100 | HR 52 | Temp 97.7°F | Ht 79.0 in | Wt 232.4 lb

## 2018-05-03 DIAGNOSIS — Z886 Allergy status to analgesic agent status: Secondary | ICD-10-CM | POA: Insufficient documentation

## 2018-05-03 DIAGNOSIS — N183 Chronic kidney disease, stage 3 unspecified: Secondary | ICD-10-CM

## 2018-05-03 DIAGNOSIS — E785 Hyperlipidemia, unspecified: Secondary | ICD-10-CM | POA: Insufficient documentation

## 2018-05-03 DIAGNOSIS — Z1211 Encounter for screening for malignant neoplasm of colon: Secondary | ICD-10-CM

## 2018-05-03 DIAGNOSIS — I129 Hypertensive chronic kidney disease with stage 1 through stage 4 chronic kidney disease, or unspecified chronic kidney disease: Secondary | ICD-10-CM | POA: Insufficient documentation

## 2018-05-03 DIAGNOSIS — Z79899 Other long term (current) drug therapy: Secondary | ICD-10-CM | POA: Insufficient documentation

## 2018-05-03 DIAGNOSIS — Z1159 Encounter for screening for other viral diseases: Secondary | ICD-10-CM

## 2018-05-03 DIAGNOSIS — M545 Low back pain, unspecified: Secondary | ICD-10-CM

## 2018-05-03 DIAGNOSIS — N189 Chronic kidney disease, unspecified: Secondary | ICD-10-CM | POA: Insufficient documentation

## 2018-05-03 DIAGNOSIS — G8929 Other chronic pain: Secondary | ICD-10-CM

## 2018-05-03 DIAGNOSIS — Z131 Encounter for screening for diabetes mellitus: Secondary | ICD-10-CM

## 2018-05-03 DIAGNOSIS — R399 Unspecified symptoms and signs involving the genitourinary system: Secondary | ICD-10-CM

## 2018-05-03 DIAGNOSIS — M170 Bilateral primary osteoarthritis of knee: Secondary | ICD-10-CM

## 2018-05-03 DIAGNOSIS — I10 Essential (primary) hypertension: Secondary | ICD-10-CM

## 2018-05-03 MED ORDER — LISINOPRIL 40 MG PO TABS: 40.0000 mg | ORAL_TABLET | Freq: Every day | ORAL | 6 refills | Status: DC

## 2018-05-03 MED ORDER — TAMSULOSIN HCL 0.4 MG PO CAPS: 0.4000 mg | ORAL_CAPSULE | Freq: Every day | ORAL | 6 refills | Status: DC

## 2018-05-03 MED ORDER — METHOCARBAMOL 500 MG PO TABS: 500.0000 mg | ORAL_TABLET | Freq: Three times a day (TID) | ORAL | 2 refills | Status: DC | PRN

## 2018-05-03 MED ORDER — HYDRALAZINE HCL 25 MG PO TABS: 25.0000 mg | ORAL_TABLET | Freq: Two times a day (BID) | ORAL | 6 refills | Status: DC

## 2018-05-03 MED ORDER — DICLOFENAC SODIUM 1 % TD GEL: 4.0000 g | Freq: Four times a day (QID) | TRANSDERMAL | 1 refills | Status: DC

## 2018-05-03 MED ORDER — AMLODIPINE BESYLATE 10 MG PO TABS: 10.0000 mg | ORAL_TABLET | Freq: Every day | ORAL | 6 refills | Status: DC
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
## 2018-05-04 LAB — CMP14+EGFR
A/G RATIO: 1.5 (ref 1.2–2.2)
ALT: 11 IU/L (ref 0–44)
AST: 16 IU/L (ref 0–40)
Albumin: 3.8 g/dL (ref 3.5–5.5)
Alkaline Phosphatase: 63 IU/L (ref 39–117)
BUN/Creatinine Ratio: 6 — ABNORMAL LOW (ref 9–20)
BUN: 6 mg/dL (ref 6–24)
Bilirubin Total: 0.4 mg/dL (ref 0.0–1.2)
CALCIUM: 8.9 mg/dL (ref 8.7–10.2)
CO2: 27 mmol/L (ref 20–29)
Chloride: 103 mmol/L (ref 96–106)
Creatinine, Ser: 1.05 mg/dL (ref 0.76–1.27)
GFR calc Af Amer: 93 mL/min/{1.73_m2} (ref >59)
GFR, EST NON AFRICAN AMERICAN: 81 mL/min/{1.73_m2} (ref >59)
GLUCOSE: 97 mg/dL (ref 65–99)
Globulin, Total: 2.6 g/dL (ref 1.5–4.5)
POTASSIUM: 4.2 mmol/L (ref 3.5–5.2)
Sodium: 142 mmol/L (ref 134–144)
Total Protein: 6.4 g/dL (ref 6.0–8.5)

## 2018-05-04 LAB — PSA, TOTAL AND FREE
PROSTATE SPECIFIC AG, SERUM: 1.1 ng/mL (ref 0.0–4.0)
PSA FREE: 0.49 ng/mL
PSA, Free Pct: 44.5 %

## 2018-05-04 LAB — HEPATITIS C ANTIBODY (REFLEX): HCV Ab: <0.1 s/co ratio (ref 0.0–0.9)

## 2018-05-04 LAB — HCV COMMENT:

## 2018-05-04 LAB — HEMOGLOBIN A1C
ESTIMATED AVERAGE GLUCOSE: 114 mg/dL
Hgb A1c MFr Bld: 5.6 % (ref 4.8–5.6)

## 2018-05-06 ENCOUNTER — Telehealth: Payer: Self-pay

## 2018-05-06 ENCOUNTER — Telehealth: Payer: Self-pay | Admitting: Internal Medicine

## 2018-05-06 NOTE — Telephone Encounter (Signed)
-----   Message from Hoy RegisterEnobong Newlin, MD sent at 05/04/2018  5:02 PM EDT ----- Blood work is negative for diabetes, prostate test is normal, hepatitis C is negative, other labs are stable.

## 2018-05-06 NOTE — Telephone Encounter (Signed)
° ° °  Patient made aware  [facility] End [facility]. [facility]

## 2018-05-06 NOTE — Telephone Encounter (Signed)
Patient was called and informed of lab results. 

## 2018-06-11 ENCOUNTER — Ambulatory Visit: Payer: Self-pay | Admitting: Internal Medicine

## 2018-07-07 ENCOUNTER — Other Ambulatory Visit: Payer: Self-pay | Admitting: Family Medicine

## 2018-07-07 DIAGNOSIS — N529 Male erectile dysfunction, unspecified: Secondary | ICD-10-CM

## 2018-07-07 MED FILL — $Viagra 50mg tablet: 50 | 90 days supply | Qty: 30 | Fill #0

## 2018-07-07 MED FILL — AMLODIPINE BESYLATE 10 MG T: 10 | 30 days supply | Qty: 30 | Fill #4

## 2018-07-07 MED FILL — ATORVASTATIN 20 MG TABLET: 20 | 30 days supply | Qty: 30 | Fill #2

## 2018-07-07 MED FILL — hydrALAZINE HCL 25 MG TABS: 25 | 30 days supply | Qty: 60 | Fill #3

## 2018-07-07 MED FILL — LISINOPRIL 40 MG TABLET: 40 | 30 days supply | Qty: 30 | Fill #0

## 2018-08-04 ENCOUNTER — Ambulatory Visit: Payer: Self-pay | Admitting: Family Medicine

## 2018-09-13 ENCOUNTER — Ambulatory Visit: Payer: Self-pay | Admitting: Physician Assistant

## 2018-09-13 DIAGNOSIS — R0989 Other specified symptoms and signs involving the circulatory and respiratory systems: Secondary | ICD-10-CM
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
## 2018-09-14 ENCOUNTER — Encounter: Payer: Self-pay | Admitting: Physician Assistant

## 2018-12-06 ENCOUNTER — Encounter: Payer: Self-pay | Admitting: Family Medicine

## 2018-12-06 ENCOUNTER — Other Ambulatory Visit: Payer: Self-pay

## 2018-12-06 ENCOUNTER — Ambulatory Visit: Payer: Self-pay | Attending: Family Medicine | Admitting: Family Medicine

## 2018-12-06 DIAGNOSIS — E7849 Other hyperlipidemia: Secondary | ICD-10-CM

## 2018-12-06 DIAGNOSIS — G8929 Other chronic pain: Secondary | ICD-10-CM

## 2018-12-06 DIAGNOSIS — M545 Low back pain: Secondary | ICD-10-CM

## 2018-12-06 DIAGNOSIS — I10 Essential (primary) hypertension: Secondary | ICD-10-CM

## 2018-12-06 DIAGNOSIS — M170 Bilateral primary osteoarthritis of knee: Secondary | ICD-10-CM

## 2018-12-06 MED ORDER — ATORVASTATIN CALCIUM 20 MG PO TABS: 20.0000 mg | ORAL_TABLET | Freq: Every day | ORAL | 1 refills | Status: DC

## 2018-12-06 MED ORDER — HYDRALAZINE HCL 25 MG PO TABS: 25.0000 mg | ORAL_TABLET | Freq: Two times a day (BID) | ORAL | 1 refills | Status: DC

## 2018-12-06 MED ORDER — DICLOFENAC SODIUM 1 % TD GEL: 4.0000 g | Freq: Four times a day (QID) | TRANSDERMAL | 1 refills | Status: DC

## 2018-12-06 MED ORDER — LISINOPRIL 40 MG PO TABS: 40.0000 mg | ORAL_TABLET | Freq: Every day | ORAL | 1 refills | Status: DC

## 2018-12-06 MED ORDER — METHOCARBAMOL 500 MG PO TABS: 500.0000 mg | ORAL_TABLET | Freq: Three times a day (TID) | ORAL | 2 refills | Status: DC | PRN

## 2018-12-06 MED ORDER — AMLODIPINE BESYLATE 10 MG PO TABS: 10.0000 mg | ORAL_TABLET | Freq: Every day | ORAL | 1 refills | Status: DC

## 2018-12-06 MED FILL — METHOCARBAMOL 500 MG TABS: 500 | 30 days supply | Qty: 90 | Fill #0

## 2018-12-06 MED FILL — DICLOFENAC SODIUM 1% GEL: 1 | 6 days supply | Qty: 100 | Fill #0

## 2018-12-06 MED FILL — hydrALAZINE HCL 25 MG TABS: 25 | 30 days supply | Qty: 60 | Fill #0

## 2018-12-06 MED FILL — AMLODIPINE BESYLATE 10 MG T: 10 | 30 days supply | Qty: 30 | Fill #0

## 2018-12-06 MED FILL — LISINOPRIL 40 MG TABLET: 40 | 30 days supply | Qty: 30 | Fill #0

## 2018-12-06 MED FILL — ATORVASTATIN 20 MG TABLET: 20 | 30 days supply | Qty: 30 | Fill #0

## 2018-12-06 NOTE — Progress Notes (Signed)
Virtual Visit via Telephone Note  I connected with Phillip Oliver on 12/06/18 at  3:30 PM EDT by telephone and verified that I am speaking with the correct person using two identifiers.   I discussed the limitations, risks, security and privacy concerns of performing an evaluation and management service by telephone and the availability of in person appointments. I also discussed with the patient that there may be a patient responsible charge related to this service. The patient expressed understanding and agreed to proceed.   History of Present Illness:  He is a 53 year old male with a history of hypertension, hyperlipidemia, erectile dysfunction, chronic bilateral osteoarthritis, here for a follow up visit.  He does have bilateral knee pain which he rates as a 5/10 at this time and pain is worse when he is at work as he is on his feet for prolonged periods on a concrete floor.  I had referred him to Orthopedics however he never followed through as he states his medications provided relief for his knee pain.  He plans to obtain a knee brace to use.  He has not been checking his blood pressure and is requesting refills of his antihypertensive. He was also seen by cardiology, Dr. Okey Dupre due to second-degree AV block noted during a previous hospitalization and he was to follow-up with cardiology however he is yet to do so.  Denies chest pain,syncope, dyspnea, diaphoresis and has a good exercise tolerance.  Observations/Objective: Awake, alert, oriented x3  CMP Latest Ref Rng & Units 05/03/2018 01/22/2018 11/09/2017  Glucose 65 - 99 mg/dL 97 79 96  BUN 6 - 24 mg/dL 6 20 15   Creatinine 0.76 - 1.27 mg/dL 0.03 4.91(P) 9.15(A)  Sodium 134 - 144 mmol/L 142 135 134(L)  Potassium 3.5 - 5.2 mmol/L 4.2 4.7 4.4  Chloride 96 - 106 mmol/L 103 99 101  CO2 20 - 29 mmol/L 27 17(L) 23  Calcium 8.7 - 10.2 mg/dL 8.9 9.6 7.9(L)  Total Protein 6.0 - 8.5 g/dL 6.4 - -  Total Bilirubin 0.0 - 1.2 mg/dL 0.4 - -  Alkaline  Phos 39 - 117 IU/L 63 - -  AST 0 - 40 IU/L 16 - -  ALT 0 - 44 IU/L 11 - -    Lipid Panel     Component Value Date/Time   CHOL 144 06/23/2017 0852   TRIG 102 06/23/2017 0852   HDL 41 06/23/2017 0852   CHOLHDL 3.5 06/23/2017 0852   CHOLHDL 3.5 02/22/2016 0959   VLDL 25 02/22/2016 0959   LDLCALC 83 06/23/2017 0852     Assessment and Plan: 1. Essential hypertension Unable to assess blood pressure control today Continue current regimen Counseled on blood pressure goal of less than 130/80, low-sodium, DASH diet, medication compliance, 150 minutes of moderate intensity exercise per week. Discussed medication compliance, adverse effects. - amLODipine (NORVASC) 10 MG tablet; Take 1 tablet (10 mg total) by mouth daily.  Dispense: 90 tablet; Refill: 1 - hydrALAZINE (APRESOLINE) 25 MG tablet; Take 1 tablet (25 mg total) by mouth 2 (two) times daily.  Dispense: 180 tablet; Refill: 1 - lisinopril (PRINIVIL,ZESTRIL) 40 MG tablet; Take 1 tablet (40 mg total) by mouth daily.  Dispense: 90 tablet; Refill: 1  2. Other hyperlipidemia Controlled Low-cholesterol diet - atorvastatin (LIPITOR) 20 MG tablet; Take 1 tablet (20 mg total) by mouth daily.  Dispense: 90 tablet; Refill: 1  3. Chronic bilateral low back pain without sciatica Stable - methocarbamol (ROBAXIN) 500 MG tablet; Take 1 tablet (500 mg total)  by mouth every 8 (eight) hours as needed for muscle spasms.  Dispense: 90 tablet; Refill: 2  4. Primary osteoarthritis of both knees Improved Advised to use knee brace Holding off on oral NSAIDs due to previous history of AKI - diclofenac sodium (VOLTAREN) 1 % GEL; Apply 4 g topically 4 (four) times daily.  Dispense: 100 g; Refill: 1   Follow Up Instructions: Advised to call back to schedule appointment   I discussed the assessment and treatment plan with the patient. The patient was provided an opportunity to ask questions and all were answered. The patient agreed with the plan and  demonstrated an understanding of the instructions.   The patient was advised to call back or seek an in-person evaluation if the symptoms worsen or if the condition fails to improve as anticipated.  I provided 9 minutes of non-face-to-face time during this encounter.   Hoy Register, MD

## 2018-12-06 NOTE — Progress Notes (Signed)
Patient was called and DOB has been verified. Patient has been screened and transferred to PCP.

## 2019-03-07 ENCOUNTER — Telehealth: Payer: Self-pay | Admitting: Family Medicine

## 2019-03-07 DIAGNOSIS — N529 Male erectile dysfunction, unspecified: Secondary | ICD-10-CM

## 2019-03-07 MED ORDER — SILDENAFIL CITRATE 50 MG PO TABS: ORAL_TABLET | ORAL | 2 refills | Status: DC

## 2019-03-07 NOTE — Telephone Encounter (Signed)
1) Medication(s) Requested (by name): VIAGRA  2) Pharmacy of Choice: [facility]  3) Special Requests: no refills remain.  Pt has 3 mo fu appt 03/15/19 [facility].   Approved medications will be sent to the pharmacy, we will reach out if there is an issue.  Requests made after 3pm may not be addressed until the following business day!  If a patient is unsure of the name of the medication(s) please note and ask patient to call back when they are able to provide all info, do not send to responsible party until all information is available!

## 2019-03-08 MED FILL — SILDENAFIL CITRATE 50 MG TA: 50 | 30 days supply | Qty: 10 | Fill #0

## 2019-03-09 MED FILL — METHOCARBAMOL 500 MG TABS: 500 | 30 days supply | Qty: 90 | Fill #1

## 2019-03-09 MED FILL — LISINOPRIL 40 MG TABLET: 40 | 30 days supply | Qty: 30 | Fill #1

## 2019-03-09 MED FILL — hydrALAZINE HCL 25 MG TABS: 25 | 30 days supply | Qty: 60 | Fill #1

## 2019-03-09 MED FILL — ATORVASTATIN 20 MG TABLET: 20 | 30 days supply | Qty: 30 | Fill #1

## 2019-03-09 MED FILL — AMLODIPINE BESYLATE 10 MG T: 10 | 30 days supply | Qty: 30 | Fill #1

## 2019-03-14 ENCOUNTER — Ambulatory Visit: Payer: Self-pay | Admitting: Family Medicine

## 2019-04-19 MED FILL — SILDENAFIL CITRATE 50 MG TA: 50 | 30 days supply | Qty: 10 | Fill #0

## 2019-04-19 MED FILL — METHOCARBAMOL 500 MG TABS: 500 | 30 days supply | Qty: 90 | Fill #1

## 2019-04-19 MED FILL — ATORVASTATIN 20 MG TABLET: 20 | 30 days supply | Qty: 30 | Fill #1

## 2019-04-19 MED FILL — hydrALAZINE HCL 25 MG TABS: 25 | 30 days supply | Qty: 60 | Fill #1

## 2019-05-11 ENCOUNTER — Encounter: Payer: Self-pay | Admitting: Family Medicine

## 2019-05-11 ENCOUNTER — Other Ambulatory Visit: Payer: Self-pay

## 2019-05-11 ENCOUNTER — Ambulatory Visit: Payer: Managed Care, Other (non HMO) | Attending: Family Medicine | Admitting: Family Medicine

## 2019-05-11 VITALS — BP 148/107 | HR 80 | Temp 98.2°F | Wt 237.6 lb

## 2019-05-11 DIAGNOSIS — I10 Essential (primary) hypertension: Secondary | ICD-10-CM

## 2019-05-11 DIAGNOSIS — F17210 Nicotine dependence, cigarettes, uncomplicated: Secondary | ICD-10-CM

## 2019-05-11 DIAGNOSIS — Z1211 Encounter for screening for malignant neoplasm of colon: Secondary | ICD-10-CM

## 2019-05-11 DIAGNOSIS — M67912 Unspecified disorder of synovium and tendon, left shoulder: Secondary | ICD-10-CM

## 2019-05-11 DIAGNOSIS — E7849 Other hyperlipidemia: Secondary | ICD-10-CM | POA: Diagnosis not present

## 2019-05-11 DIAGNOSIS — M170 Bilateral primary osteoarthritis of knee: Secondary | ICD-10-CM | POA: Diagnosis not present

## 2019-05-11 DIAGNOSIS — F17200 Nicotine dependence, unspecified, uncomplicated: Secondary | ICD-10-CM

## 2019-05-11 MED ORDER — TRAMADOL HCL 50 MG PO TABS: 50.0000 mg | ORAL_TABLET | Freq: Three times a day (TID) | ORAL | 0 refills | Status: AC | PRN

## 2019-05-11 MED ORDER — METHOCARBAMOL 500 MG PO TABS: 500.0000 mg | ORAL_TABLET | Freq: Three times a day (TID) | ORAL | 2 refills | Status: DC | PRN

## 2019-05-11 MED ORDER — KETOROLAC TROMETHAMINE 60 MG/2ML IM SOLN
60.0000 mg | Freq: Once | INTRAMUSCULAR | Status: AC
  Administered 2019-05-11: 16:00:00 60 mg via INTRAMUSCULAR

## 2019-05-11 MED ORDER — LISINOPRIL 40 MG PO TABS: 40.0000 mg | ORAL_TABLET | Freq: Every day | ORAL | 1 refills | Status: DC

## 2019-05-11 MED ORDER — HYDRALAZINE HCL 25 MG PO TABS: 25.0000 mg | ORAL_TABLET | Freq: Two times a day (BID) | ORAL | 1 refills | Status: DC

## 2019-05-11 MED ORDER — ATORVASTATIN CALCIUM 20 MG PO TABS: 20.0000 mg | ORAL_TABLET | Freq: Every day | ORAL | 1 refills | Status: DC

## 2019-05-11 MED ORDER — AMLODIPINE BESYLATE 10 MG PO TABS: 10.0000 mg | ORAL_TABLET | Freq: Every day | ORAL | 1 refills | Status: DC
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
## 2019-05-11 NOTE — Patient Instructions (Signed)

## 2019-05-12 LAB — CMP14+EGFR
ALT: 15 IU/L (ref 0–44)
AST: 20 IU/L (ref 0–40)
Albumin/Globulin Ratio: 1.7 (ref 1.2–2.2)
Albumin: 3.8 g/dL (ref 3.8–4.9)
Alkaline Phosphatase: 50 IU/L (ref 39–117)
BUN/Creatinine Ratio: 9 (ref 9–20)
BUN: 11 mg/dL (ref 6–24)
Bilirubin Total: 0.3 mg/dL (ref 0.0–1.2)
CO2: 24 mmol/L (ref 20–29)
Calcium: 9.2 mg/dL (ref 8.7–10.2)
Chloride: 105 mmol/L (ref 96–106)
Creatinine, Ser: 1.22 mg/dL (ref 0.76–1.27)
GFR calc Af Amer: 77 mL/min/{1.73_m2} (ref >59)
GFR calc non Af Amer: 66 mL/min/{1.73_m2} (ref >59)
Globulin, Total: 2.3 g/dL (ref 1.5–4.5)
Glucose: 98 mg/dL (ref 65–99)
Potassium: 4.6 mmol/L (ref 3.5–5.2)
Sodium: 139 mmol/L (ref 134–144)
Total Protein: 6.1 g/dL (ref 6.0–8.5)

## 2019-05-12 MED FILL — traMADol HCL 50 MG TABS: 50 | 5 days supply | Qty: 15 | Fill #0

## 2019-05-12 MED FILL — hydrALAZINE HCL 25 MG TABS: 25 | 30 days supply | Qty: 60 | Fill #0

## 2019-05-12 MED FILL — ATORVASTATIN 20 MG TABLET: 20 | 30 days supply | Qty: 30 | Fill #0

## 2019-05-12 MED FILL — AMLODIPINE BESYLATE 10 MG T: 10 | 30 days supply | Qty: 30 | Fill #0

## 2019-05-12 MED FILL — METHOCARBAMOL 500 MG TABS: 500 | 30 days supply | Qty: 90 | Fill #0

## 2019-05-12 MED FILL — LISINOPRIL 40 MG TABLET: 40 | 30 days supply | Qty: 30 | Fill #0

## 2019-05-13 ENCOUNTER — Telehealth: Payer: Self-pay

## 2019-05-13 NOTE — Telephone Encounter (Signed)
-----   Message from Enobong Newlin, MD sent at 05/12/2019 11:36 AM EDT ----- Please inform the patient that labs are normal. Thank you. 

## 2019-05-13 NOTE — Telephone Encounter (Signed)
Patient was called and a voicemail was left informing patient to return phone call for lab results. 

## 2019-05-17 MED FILL — SILDENAFIL CITRATE 50 MG TA: 50 | 30 days supply | Qty: 10 | Fill #1

## 2019-05-20 ENCOUNTER — Telehealth: Payer: Self-pay

## 2019-05-20 MED FILL — METHOCARBAMOL 500 MG TABS: 500 | 30 days supply | Qty: 90 | Fill #0

## 2019-05-20 MED FILL — hydrALAZINE HCL 25 MG TABS: 25 | 30 days supply | Qty: 60 | Fill #0

## 2019-05-20 MED FILL — AMLODIPINE BESYLATE 10 MG T: 10 | 30 days supply | Qty: 30 | Fill #0

## 2019-05-20 MED FILL — traMADol HCL 50 MG TABS: 50 | 5 days supply | Qty: 15 | Fill #0

## 2019-05-20 MED FILL — LISINOPRIL 40 MG TABLET: 40 | 30 days supply | Qty: 30 | Fill #0

## 2019-05-20 MED FILL — ATORVASTATIN 20 MG TABLET: 20 | 30 days supply | Qty: 30 | Fill #0

## 2019-05-20 NOTE — Telephone Encounter (Signed)
Patient was called and recording states that call can not be completed.

## 2019-05-20 NOTE — Telephone Encounter (Signed)
-----   Message from Enobong Newlin, MD sent at 05/12/2019 11:36 AM EDT ----- Please inform the patient that labs are normal. Thank you. 

## 2019-06-16 ENCOUNTER — Telehealth: Payer: Self-pay | Admitting: Family Medicine

## 2019-06-16 ENCOUNTER — Telehealth: Payer: Self-pay

## 2019-06-16 DIAGNOSIS — N529 Male erectile dysfunction, unspecified: Secondary | ICD-10-CM

## 2019-06-16 MED ORDER — SILDENAFIL CITRATE 50 MG PO TABS: ORAL_TABLET | ORAL | 2 refills | Status: DC

## 2019-06-16 NOTE — Telephone Encounter (Signed)
Refilled

## 2019-06-16 NOTE — Telephone Encounter (Signed)
Pt would like lab results, Phone number has been updated. please follow up when possible

## 2019-06-16 NOTE — Telephone Encounter (Signed)
Patient was given lab results

## 2019-06-16 NOTE — Telephone Encounter (Signed)
Patient is requesting refill on Viagra 

## 2019-08-11 ENCOUNTER — Telehealth: Payer: Self-pay | Admitting: Family Medicine

## 2019-08-11 NOTE — Telephone Encounter (Signed)
Patient called about his sildenafil (VIAGRA) 50 MG tablet he wants it changed because his insurance wont pay for it

## 2019-08-12 MED ORDER — TADALAFIL 10 MG PO TABS: 10.0000 mg | ORAL_TABLET | ORAL | 1 refills | Status: AC | PRN

## 2019-08-12 NOTE — Telephone Encounter (Signed)
I have changed it to Cialis

## 2019-09-05 ENCOUNTER — Telehealth: Payer: Self-pay | Admitting: Family Medicine

## 2019-11-02 ENCOUNTER — Ambulatory Visit: Payer: Self-pay

## 2019-11-02 ENCOUNTER — Ambulatory Visit (INDEPENDENT_AMBULATORY_CARE_PROVIDER_SITE_OTHER): Payer: Managed Care, Other (non HMO) | Admitting: Orthopedic Surgery

## 2019-11-02 ENCOUNTER — Ambulatory Visit (INDEPENDENT_AMBULATORY_CARE_PROVIDER_SITE_OTHER): Payer: Managed Care, Other (non HMO)

## 2019-11-02 ENCOUNTER — Other Ambulatory Visit: Payer: Self-pay

## 2019-11-02 DIAGNOSIS — M545 Low back pain, unspecified: Secondary | ICD-10-CM

## 2019-11-02 DIAGNOSIS — M25511 Pain in right shoulder: Secondary | ICD-10-CM | POA: Diagnosis not present

## 2019-11-02 DIAGNOSIS — M25512 Pain in left shoulder: Secondary | ICD-10-CM

## 2019-11-02 MED ORDER — MELOXICAM 15 MG PO TABS: ORAL_TABLET | ORAL | 0 refills | Status: AC

## 2019-11-02 MED FILL — MELOXICAM 15 MG TABLET: 15 | 15 days supply | Qty: 15 | Fill #0

## 2019-11-04 ENCOUNTER — Encounter: Payer: Self-pay | Admitting: Orthopedic Surgery
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
## 2019-11-05 ENCOUNTER — Encounter: Payer: Self-pay | Admitting: Orthopedic Surgery

## 2019-11-14 MED FILL — PEG-3350 SOLUTION: 420 | 1 days supply | Qty: 4000 | Fill #0

## 2019-11-23 MED FILL — MELOXICAM 15 MG TABLET: 15 | 15 days supply | Qty: 15 | Fill #0

## 2019-12-05 ENCOUNTER — Ambulatory Visit: Payer: Managed Care, Other (non HMO) | Attending: Family Medicine | Admitting: Physician Assistant

## 2019-12-05 ENCOUNTER — Other Ambulatory Visit: Payer: Self-pay

## 2019-12-05 VITALS — BP 154/97 | HR 68 | Temp 98.6°F | Resp 18 | Ht 79.0 in | Wt 221.0 lb

## 2019-12-05 DIAGNOSIS — E7849 Other hyperlipidemia: Secondary | ICD-10-CM | POA: Diagnosis not present

## 2019-12-05 DIAGNOSIS — Z791 Long term (current) use of non-steroidal anti-inflammatories (NSAID): Secondary | ICD-10-CM | POA: Insufficient documentation

## 2019-12-05 DIAGNOSIS — R079 Chest pain, unspecified: Secondary | ICD-10-CM | POA: Diagnosis not present

## 2019-12-05 DIAGNOSIS — Z7901 Long term (current) use of anticoagulants: Secondary | ICD-10-CM | POA: Insufficient documentation

## 2019-12-05 DIAGNOSIS — M546 Pain in thoracic spine: Secondary | ICD-10-CM | POA: Diagnosis not present

## 2019-12-05 DIAGNOSIS — G8929 Other chronic pain: Secondary | ICD-10-CM | POA: Diagnosis not present

## 2019-12-05 DIAGNOSIS — K219 Gastro-esophageal reflux disease without esophagitis: Secondary | ICD-10-CM | POA: Insufficient documentation

## 2019-12-05 DIAGNOSIS — M545 Low back pain: Secondary | ICD-10-CM | POA: Diagnosis present

## 2019-12-05 DIAGNOSIS — Z79899 Other long term (current) drug therapy: Secondary | ICD-10-CM | POA: Diagnosis not present

## 2019-12-05 DIAGNOSIS — M542 Cervicalgia: Secondary | ICD-10-CM | POA: Insufficient documentation

## 2019-12-05 DIAGNOSIS — I10 Essential (primary) hypertension: Secondary | ICD-10-CM | POA: Diagnosis not present

## 2019-12-05 DIAGNOSIS — F17210 Nicotine dependence, cigarettes, uncomplicated: Secondary | ICD-10-CM | POA: Insufficient documentation

## 2019-12-05 MED ORDER — KETOROLAC TROMETHAMINE 60 MG/2ML IM SOLN
60.0000 mg | Freq: Once | INTRAMUSCULAR | Status: AC
  Administered 2019-12-05: 60 mg via INTRAMUSCULAR

## 2019-12-05 MED ORDER — ATORVASTATIN CALCIUM 20 MG PO TABS: 20.0000 mg | ORAL_TABLET | Freq: Every day | ORAL | 0 refills | Status: DC

## 2019-12-05 MED ORDER — CYCLOBENZAPRINE HCL 10 MG PO TABS: 10.0000 mg | ORAL_TABLET | Freq: Three times a day (TID) | ORAL | 0 refills | Status: DC | PRN

## 2019-12-05 MED ORDER — LISINOPRIL 40 MG PO TABS: 40.0000 mg | ORAL_TABLET | Freq: Every day | ORAL | 0 refills | Status: DC

## 2019-12-05 MED ORDER — AMLODIPINE BESYLATE 10 MG PO TABS: 10.0000 mg | ORAL_TABLET | Freq: Every day | ORAL | 1 refills | Status: DC

## 2019-12-05 MED ORDER — HYDRALAZINE HCL 25 MG PO TABS: 25.0000 mg | ORAL_TABLET | Freq: Two times a day (BID) | ORAL | 0 refills | Status: DC

## 2019-12-05 MED FILL — AMLODIPINE BESYLATE 10 MG T: 10 | 30 days supply | Qty: 30 | Fill #0

## 2019-12-05 MED FILL — hydrALAZINE HCL 25 MG TABS: 25 | 30 days supply | Qty: 60 | Fill #0

## 2019-12-05 MED FILL — LISINOPRIL 40 MG TABLET: 40 | 30 days supply | Qty: 30 | Fill #0

## 2019-12-05 MED FILL — ATORVASTATIN CALCIUM 20 MG: 20 | 30 days supply | Qty: 30 | Fill #0

## 2019-12-05 MED FILL — CYCLOBENZAPRINE 10 MG TAB: 10 | 10 days supply | Qty: 30 | Fill #0

## 2019-12-05 MED FILL — MELOXICAM 15 MG TABLET: 15 | 15 days supply | Qty: 15 | Fill #0

## 2019-12-05 NOTE — Progress Notes (Signed)
Patient states pain began last Wednesday and was moving 50 pound drums. Neck pain was felt then. Chest pain began Thursday when patients wakes up in the morning with a sharp pain for 3 minutes and laying down at night. BP medications been out for 2 months.

## 2019-12-05 NOTE — Patient Instructions (Addendum)
For your back pain I have prescribed Flexeril, this is a muscle relaxer, this should be inexpensive, it should offer you relief.  I recommend following up with orthopedics, gentle stretching, Voltaren over-the-counter, heat and ice as tolerated. Phillip Oliver, Dr. Lorin Picket  For your chest pain, your EKG was within normal limits, recommend resuming your blood pressure medications, I have sent them to your pharmacy, follow-up with Dr. Alvis Lemmings for medication management in 1 month, trial Prilosec over-the-counter   Nonspecific Chest Pain Chest pain can be caused by many different conditions. Some causes of chest pain can be life-threatening. These will require treatment right away. Serious causes of chest pain include: Heart attack. A tear in the body's main blood vessel. Redness and swelling (inflammation) around your heart. Blood clot in your lungs. Other causes of chest pain may not be so serious. These include: Heartburn. Anxiety or stress. Damage to bones or muscles in your chest. Lung infections. Chest pain can feel like: Pain or discomfort in your chest. Crushing, pressure, aching, or squeezing pain. Burning or tingling. Dull or sharp pain that is worse when you move, cough, or take a deep breath. Pain or discomfort that is also felt in your back, neck, jaw, shoulder, or arm, or pain that spreads to any of these areas. It is hard to know whether your pain is caused by something that is serious or something that is not so serious. So it is important to see your doctor right away if you have chest pain. Follow these instructions at home: Medicines Take over-the-counter and prescription medicines only as told by your doctor. If you were prescribed an antibiotic medicine, take it as told by your doctor. Do not stop taking the antibiotic even if you start to feel better. Lifestyle  Rest as told by your doctor. Do not use any products that contain nicotine or tobacco, such as  cigarettes, e-cigarettes, and chewing tobacco. If you need help quitting, ask your doctor. Do not drink alcohol. Make lifestyle changes as told by your doctor. These may include: Getting regular exercise. Ask your doctor what activities are safe for you. Eating a heart-healthy diet. A diet and nutrition specialist (dietitian) can help you to learn healthy eating options. Staying at a healthy weight. Treating diabetes or high blood pressure, if needed. Lowering your stress. Activities such as yoga and relaxation techniques can help. General instructions Pay attention to any changes in your symptoms. Tell your doctor about them or any new symptoms. Avoid any activities that cause chest pain. Keep all follow-up visits as told by your doctor. This is important. You may need more testing if your chest pain does not go away. Contact a doctor if: Your chest pain does not go away. You feel depressed. You have a fever. Get help right away if: Your chest pain is worse. You have a cough that gets worse, or you cough up blood. You have very bad (severe) pain in your belly (abdomen). You pass out (faint). You have either of these for no clear reason: Sudden chest discomfort. Sudden discomfort in your arms, back, neck, or jaw. You have shortness of breath at any time. You suddenly start to sweat, or your skin gets clammy. You feel sick to your stomach (nauseous). You throw up (vomit). You suddenly feel lightheaded or dizzy. You feel very weak or tired. Your heart starts to beat fast, or it feels like it is skipping beats. These symptoms may be an emergency. Do not wait to see if the  symptoms will go away. Get medical help right away. Call your local emergency services (911 in the [place] Do not drive yourself to the hospital. Summary Chest pain can be caused by many different conditions. The cause may be serious and need treatment right away. If you have chest pain, see your doctor right away.  Follow your doctor's instructions for taking medicines and making lifestyle changes. Keep all follow-up visits as told by your doctor. This includes visits for any further testing if your chest pain does not go away. Be sure to know the signs that show that your condition has become worse. Get help right away if you have these symptoms. This information is not intended to replace advice given to you by your health care provider. Make sure you discuss any questions you have with your health care provider. Document Revised: 02/25/2018 Document Reviewed: 02/25/2018 Elsevier Patient Education  2020 [employer]  Back Exercises The following exercises strengthen the muscles that help to support the trunk and back. They also help to keep the lower back flexible. Doing these exercises can help to prevent back pain or lessen existing pain.  If you have back pain or discomfort, try doing these exercises 2-3 times each day or as told by your health care provider.  As your pain improves, do them once each day, but increase the number of times that you repeat the steps for each exercise (do more repetitions).  To prevent the recurrence of back pain, continue to do these exercises once each day or as told by your health care provider. Do exercises exactly as told by your health care provider and adjust them as directed. It is normal to feel mild stretching, pulling, tightness, or discomfort as you do these exercises, but you should stop right away if you feel sudden pain or your pain gets worse. Exercises Single knee to chest Repeat these steps 3-5 times for each leg: 1. Lie on your back on a firm bed or the floor with your legs extended. 2. Bring one knee to your chest. Your other leg should stay extended and in contact with the floor. 3. Hold your knee in place by grabbing your knee or thigh with both hands and hold. 4. Pull on your knee until you feel a gentle stretch in your lower back or buttocks.  5. Hold the stretch for 10-30 seconds. 6. Slowly release and straighten your leg. Pelvic tilt Repeat these steps 5-10 times: 1. Lie on your back on a firm bed or the floor with your legs extended. 2. Bend your knees so they are pointing toward the ceiling and your feet are flat on the floor. 3. Tighten your lower abdominal muscles to press your lower back against the floor. This motion will tilt your pelvis so your tailbone points up toward the ceiling instead of pointing to your feet or the floor. 4. With gentle tension and even breathing, hold this position for 5-10 seconds. Cat-cow Repeat these steps until your lower back becomes more flexible: 1. Get into a hands-and-knees position on a firm surface. Keep your hands under your shoulders, and keep your knees under your hips. You may place padding under your knees for comfort. 2. Let your head hang down toward your chest. Contract your abdominal muscles and point your tailbone toward the floor so your lower back becomes rounded like the back of a cat. 3. Hold this position for 5 seconds. 4. Slowly lift your head, let your abdominal muscles relax and point  your tailbone up toward the ceiling so your back forms a sagging arch like the back of a cow. 5. Hold this position for 5 seconds.  Press-ups Repeat these steps 5-10 times: 1. Lie on your abdomen (face-down) on the floor. 2. Place your palms near your head, about shoulder-width apart. 3. Keeping your back as relaxed as possible and keeping your hips on the floor, slowly straighten your arms to raise the top half of your body and lift your shoulders. Do not use your back muscles to raise your upper torso. You may adjust the placement of your hands to make yourself more comfortable. 4. Hold this position for 5 seconds while you keep your back relaxed. 5. Slowly return to lying flat on the floor.  Bridges Repeat these steps 10 times: 1. Lie on your back on a firm surface. 2. Bend your  knees so they are pointing toward the ceiling and your feet are flat on the floor. Your arms should be flat at your sides, next to your body. 3. Tighten your buttocks muscles and lift your buttocks off the floor until your waist is at almost the same height as your knees. You should feel the muscles working in your buttocks and the back of your thighs. If you do not feel these muscles, slide your feet 1-2 inches farther away from your buttocks. 4. Hold this position for 3-5 seconds. 5. Slowly lower your hips to the starting position, and allow your buttocks muscles to relax completely. If this exercise is too easy, try doing it with your arms crossed over your chest. Abdominal crunches Repeat these steps 5-10 times: 1. Lie on your back on a firm bed or the floor with your legs extended. 2. Bend your knees so they are pointing toward the ceiling and your feet are flat on the floor. 3. Cross your arms over your chest. 4. Tip your chin slightly toward your chest without bending your neck. 5. Tighten your abdominal muscles and slowly raise your trunk (torso) high enough to lift your shoulder blades a tiny bit off the floor. Avoid raising your torso higher than that because it can put too much stress on your low back and does not help to strengthen your abdominal muscles. 6. Slowly return to your starting position. Back lifts Repeat these steps 5-10 times: 1. Lie on your abdomen (face-down) with your arms at your sides, and rest your forehead on the floor. 2. Tighten the muscles in your legs and your buttocks. 3. Slowly lift your chest off the floor while you keep your hips pressed to the floor. Keep the back of your head in line with the curve in your back. Your eyes should be looking at the floor. 4. Hold this position for 3-5 seconds. 5. Slowly return to your starting position. Contact a health care provider if:  Your back pain or discomfort gets much worse when you do an exercise.  Your  worsening back pain or discomfort does not lessen within 2 hours after you exercise. If you have any of these problems, stop doing these exercises right away. Do not do them again unless your health care provider says that you can. Get help right away if:  You develop sudden, severe back pain. If this happens, stop doing the exercises right away. Do not do them again unless your health care provider says that you can. This information is not intended to replace advice given to you by your health care provider. Make sure you discuss  any questions you have with your health care provider. Document Revised: 12/30/2018 Document Reviewed: 05/27/2018 [employer] Patient Education  [employer].
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
## 2019-12-06 NOTE — Telephone Encounter (Signed)
error 

## 2020-04-23 ENCOUNTER — Ambulatory Visit: Payer: Self-pay | Admitting: *Deleted

## 2020-04-23 NOTE — Telephone Encounter (Signed)
Attempted to call patient back x 3. appt scheduled for 04/24/20.

## 2020-04-24 ENCOUNTER — Other Ambulatory Visit: Payer: Self-pay

## 2020-04-24 ENCOUNTER — Ambulatory Visit: Payer: Self-pay | Attending: Family | Admitting: Family

## 2020-04-24 ENCOUNTER — Encounter: Payer: Self-pay | Admitting: Family

## 2020-04-24 VITALS — BP 134/102 | HR 67 | Temp 98.1°F | Resp 16 | Wt 220.6 lb

## 2020-04-24 DIAGNOSIS — M170 Bilateral primary osteoarthritis of knee: Secondary | ICD-10-CM

## 2020-04-24 DIAGNOSIS — Z111 Encounter for screening for respiratory tuberculosis: Secondary | ICD-10-CM

## 2020-04-24 MED ORDER — TRAMADOL HCL 50 MG PO TABS: 100.0000 mg | ORAL_TABLET | Freq: Two times a day (BID) | ORAL | 0 refills | Status: AC

## 2020-04-24 MED ORDER — DICLOFENAC SODIUM 1 % EX GEL: 4.0000 g | Freq: Four times a day (QID) | CUTANEOUS | 1 refills | Status: DC

## 2020-04-24 MED FILL — traMADol HCL 50 MG TABS: 50 | 5 days supply | Qty: 20 | Fill #0

## 2020-04-24 MED FILL — DICLOFENAC SODIUM 1% GEL: 1 | 6 days supply | Qty: 100 | Fill #0

## 2020-04-24 NOTE — Patient Instructions (Signed)
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 Follow-up with primary physician as needed. Osteoarthritis  Osteoarthritis is a type of arthritis that affects tissue that covers the ends of bones in joints (cartilage). Cartilage acts as a cushion between the bones and helps them move smoothly. Osteoarthritis results when cartilage in the joints gets worn down. Osteoarthritis is sometimes called "wear and tear" arthritis. Osteoarthritis is the most common form of arthritis. It often occurs in older people. It is a condition that gets worse over time (a progressive condition). Joints that are most often affected by this condition are in:  Fingers.  Toes.  Hips.  Knees.  Spine, including neck and lower back. What are the causes? This condition is caused by age-related wearing down of cartilage that covers the ends of bones. What increases the risk? The following factors may make you more likely to develop this condition:  Older age.  Being overweight or obese.  Overuse of joints, such as in athletes.  Past injury of a joint.  Past surgery on a joint.  Family history of osteoarthritis. What are the signs or symptoms? The main symptoms of this condition are pain, swelling, and stiffness in the joint. The joint may lose its shape over time. Small pieces of bone or cartilage may break off and float inside of the joint, which may cause more pain and damage to the joint. Small deposits of bone (osteophytes) may grow on the edges of the joint. Other symptoms may include:  A grating or scraping feeling inside the joint when you move it.  Popping or creaking sounds when you move. Symptoms may affect one or more joints. Osteoarthritis in a major joint, such as your knee or hip, can make it painful to walk or exercise. If you have osteoarthritis in your hands, you might not be able to grip items, twist your hand, or control small movements of your hands and fingers (fine motor  skills). How is this diagnosed? This condition may be diagnosed based on:  Your medical history.  A physical exam.  Your symptoms.  X-rays of the affected joint(s).  Blood tests to rule out other types of arthritis. How is this treated? There is no cure for this condition, but treatment can help to control pain and improve joint function. Treatment plans may include:  A prescribed exercise program that allows for rest and joint relief. You may work with a physical therapist.  A weight control plan.  Pain relief techniques, such as: ? Applying heat and cold to the joint. ? Electric pulses delivered to nerve endings under the skin (transcutaneous electrical nerve stimulation, or TENS). ? Massage. ? Certain nutritional supplements.  NSAIDs or prescription medicines to help relieve pain.  Medicine to help relieve pain and inflammation (corticosteroids). This can be given by mouth (orally) or as an injection.  Assistive devices, such as a brace, wrap, splint, specialized glove, or cane.  Surgery, such as: ? An osteotomy. This is done to reposition the bones and relieve pain or to remove loose pieces of bone and cartilage. ? Joint replacement surgery. You may need this surgery if you have very bad (advanced) osteoarthritis. Follow these instructions at home: Activity  Rest your affected joints as directed by your health care provider.  Do not drive or use heavy machinery while taking prescription pain medicine.  Exercise as directed. Your health care provider or physical therapist may recommend specific types of exercise, such as: ? Strengthening exercises. These are done to  strengthen the muscles that support joints that are affected by arthritis. They can be performed with weights or with exercise bands to add resistance. ? Aerobic activities. These are exercises, such as brisk walking or water aerobics, that get your heart pumping. ? Range-of-motion activities. These keep  your joints easy to move. ? Balance and agility exercises. Managing pain, stiffness, and swelling      If directed, apply heat to the affected area as often as told by your health care provider. Use the heat source that your health care provider recommends, such as a moist heat pack or a heating pad. ? If you have a removable assistive device, remove it as told by your health care provider. ? Place a towel between your skin and the heat source. If your health care provider tells you to keep the assistive device on while you apply heat, place a towel between the assistive device and the heat source. ? Leave the heat on for 20-30 minutes. ? Remove the heat if your skin turns bright red. This is especially important if you are unable to feel pain, heat, or cold. You may have a greater risk of getting burned.  If directed, put ice on the affected joint: ? If you have a removable assistive device, remove it as told by your health care provider. ? Put ice in a plastic bag. ? Place a towel between your skin and the bag. If your health care provider tells you to keep the assistive device on during icing, place a towel between the assistive device and the bag. ? Leave the ice on for 20 minutes, 2-3 times a day. General instructions  Take over-the-counter and prescription medicines only as told by your health care provider.  Maintain a healthy weight. Follow instructions from your health care provider for weight control. These may include dietary restrictions.  Do not use any products that contain nicotine or tobacco, such as cigarettes and e-cigarettes. These can delay bone healing. If you need help quitting, ask your health care provider.  Use assistive devices as directed by your health care provider.  Keep all follow-up visits as told by your health care provider. This is important. Where to find more information  [employer] of Arthritis and Musculoskeletal and Skin Diseases:  www.niams.http://www.myers.net/  [employer] on Aging: https://walker.com/  American College of Rheumatology: www.rheumatology.org Contact a health care provider if:  Your skin turns red.  You develop a rash.  You have pain that gets worse.  You have a fever along with joint or muscle aches. Get help right away if:  You lose a lot of weight.  You suddenly lose your appetite.  You have night sweats. Summary  Osteoarthritis is a type of arthritis that affects tissue covering the ends of bones in joints (cartilage).  This condition is caused by age-related wearing down of cartilage that covers the ends of bones.  The main symptom of this condition is pain, swelling, and stiffness in the joint.  There is no cure for this condition, but treatment can help to control pain and improve joint function. This information is not intended to replace advice given to you by your health care provider. Make sure you discuss any questions you have with your health care provider. Document Revised: 08/07/2017 Document Reviewed: 04/28/2016 Elsevier Patient Education  2020 [employer].

## 2020-04-24 NOTE — Progress Notes (Addendum)
Patient ID: Phillip Oliver, male    DOB: [birth year]  MRN: [id number]  CC: Knee Pain  Subjective: Phillip Oliver is a 54 y.o. male with history of essential hypertension, syncope, osteoarthritis, chronic kidney disease, hyperlipidemia, and polysubstance abuse who presents for knee pain.  1. KNEE PAIN: Last visit 05/11/2019 with Dr. Alvis Lemmings. During that encounter knee pain uncontrolled. Unable to use oral NSAID's due to NSAID induced acute kidney injury in the past. Voltaren gel recommended. Will need corticosteroid injections. Referral to Orthopedics.  Today reports he saw Orthopedics on last year and presribed medication, cannot remember what it was, and told he would need injections. Reports he did not receive follow-up from anyone.   Duration: days Involved knee: bilateral left is worse Mechanism of injury: unknown Location:anterior  Severity: 10/10  Quality:  feels like needles and bone-on-bone and pressure-like  Frequency: intermittent Radiation: middle of back  Aggravating factors: weight bearing and walking  Alleviating factors: otc medication 500 mg   Status: worse Treatments attempted: reports he purchased over-the-counter medication and cannot remember the name, Tramadol, and Voltaren gel which he reports both help some reports he does not feel like Tramadol is strong enough dose Relief with NSAIDs?:  mild Weakness with weight bearing or walking: yes Sensation of giving way: yes Locking: no Popping: no Bruising: no Swelling: yes Redness: no Paresthesias/decreased sensation: no Fevers: no   2. TUBERCULOSIS SCREENING: Today reports he is beginning new job. States they require PPD. Reports he is allergic to PPD and will need chest x-ray instead.   Patient Active Problem List   Diagnosis Date Noted  . Chronic kidney disease 05/03/2018  . Polysubstance abuse (HCC) 12/14/2017  . Syncope 08/14/2017  . Hyperlipidemia 02/25/2016  . Osteoarthritis 02/22/2016  . Erectile  dysfunction 02/22/2016  . Essential hypertension 12/24/2015  . Tooth ache 12/24/2015  . Tobacco use disorder 12/24/2015     Current Outpatient Medications on File Prior to Visit  Medication Sig Dispense Refill  . amLODipine (NORVASC) 10 MG tablet Take 1 tablet (10 mg total) by mouth daily. 90 tablet 1  . atorvastatin (LIPITOR) 20 MG tablet Take 1 tablet (20 mg total) by mouth daily. 30 tablet 0  . cyclobenzaprine (FLEXERIL) 10 MG tablet Take 1 tablet (10 mg total) by mouth 3 (three) times daily as needed for muscle spasms. 30 tablet 0  . diclofenac sodium (VOLTAREN) 1 % GEL Apply 4 g topically 4 (four) times daily. 100 g 1  . hydrALAZINE (APRESOLINE) 25 MG tablet Take 1 tablet (25 mg total) by mouth 2 (two) times daily. 60 tablet 0  . lisinopril (ZESTRIL) 40 MG tablet Take 1 tablet (40 mg total) by mouth daily. 30 tablet 0  . meloxicam (MOBIC) 15 MG tablet 1 po q d 15 tablet 0  . methocarbamol (ROBAXIN) 500 MG tablet Take 1 tablet (500 mg total) by mouth every 8 (eight) hours as needed for muscle spasms. 90 tablet 2  . tadalafil (CIALIS) 10 MG tablet Take 1 tablet (10 mg total) by mouth every other day as needed for erectile dysfunction. 10 tablet 1   No current facility-administered medications on file prior to visit.    Allergies  Allergen Reactions  . Albuterol Sulfate     Swollen glands   . Florastor [Yeast]     Glands swollen per patient   . Tylenol [Acetaminophen] Other (See Comments)    Patient states that his mother told him this "years ago" as a child    Social  History   Socioeconomic History  . Marital status: Single    Spouse name: Not on file  . Number of children: Not on file  . Years of education: Not on file  . Highest education level: Not on file  Occupational History  . Not on file  Tobacco Use  . Smoking status: Current Every Day Smoker    Packs/day: 0.30    Years: 30.00    Pack years: 9.00    Types: Cigarettes  . Smokeless tobacco: Current User    Vaping Use  . Vaping Use: Never used  Substance and Sexual Activity  . Alcohol use: Yes    Alcohol/week: 14.0 standard drinks    Types: 12 Cans of beer, 2 Standard drinks or equivalent per week  . Drug use: Yes    Frequency: 7.0 times per week    Types: Marijuana  . Sexual activity: Not on file  Other Topics Concern  . Not on file  Social History Narrative  . Not on file   Social Determinants of Health   Financial Resource Strain:   . Difficulty of Paying Living Expenses:   Food Insecurity:   . Worried About Programme researcher, broadcasting/film/video in the Last Year:   . Barista in the Last Year:   Transportation Needs:   . Freight forwarder (Medical):   Marland Kitchen Lack of Transportation (Non-Medical):   Physical Activity:   . Days of Exercise per Week:   . Minutes of Exercise per Session:   Stress:   . Feeling of Stress :   Social Connections:   . Frequency of Communication with Friends and Family:   . Frequency of Social Gatherings with Friends and Family:   . Attends Religious Services:   . Active Member of Clubs or Organizations:   . Attends Banker Meetings:   Marland Kitchen Marital Status:   Intimate Partner Violence:   . Fear of Current or Ex-Partner:   . Emotionally Abused:   Marland Kitchen Physically Abused:   . Sexually Abused:     Family History  Problem Relation Age of Onset  . Diabetes Mother   . Hypertension Mother   . Hypertension Sister   . Syncope episode Sister   . Diabetes Brother     No past surgical history on file.  ROS: Review of Systems Negative except as stated above  PHYSICAL EXAM: Vitals with BMI 04/24/2020 12/06/2019 12/05/2019  Height - - 6\' 7"   Weight 220 lbs 10 oz - 221 lbs  BMI - - 24.89  Systolic 134 154  Diastolic 102 97 107  Pulse 67 68 83   Wt Readings from Last 3 Encounters:  04/24/20 220 lb 9.6 oz (100.1 kg)  12/05/19 221 lb (100.2 kg)  05/11/19 237 lb 9.6 oz (107.8 kg)    Physical Exam General appearance - alert, well appearing,  and in no distress and oriented to person, place, and time Mental status - alert, oriented to person, place, and time, normal mood, behavior, speech, dress, motor activity, and thought processes Neck - supple, no significant adenopathy Lymphatics - no palpable lymphadenopathy, no hepatosplenomegaly Chest - clear to auscultation, no wheezes, rales or rhonchi, symmetric air entry, no tachypnea, retractions or cyanosis Heart - normal rate, regular rhythm, normal S1, S2, no murmurs, rubs, clicks or gallops Neurological - alert, oriented, normal speech, no focal findings or movement disorder noted, neck supple without rigidity, cranial nerves II through XII intact, funduscopic exam normal, discs flat and sharp,  DTR's normal and symmetric, motor and sensory grossly normal bilaterally, normal muscle tone, no tremors, strength 5/5, Romberg sign negative, normal gait and station Musculoskeletal - tenderness on range of motion of bilateral knees left greater than right, edema left knee Extremities - peripheral pulses normal, no pedal edema, no clubbing or cyanosis, no edema, redness or tenderness in the calves or thighs Skin - normal coloration and turgor, no rashes, no suspicious skin lesions noted  ASSESSMENT AND PLAN: 1. Primary osteoarthritis of both knees: - Uncontrolled chronic condition. - Unable to use oral NSAID's due to NSAID induced acute kidney injury in the past.  - Increase Tramadol from previous 50 mg by mouth every eight hours as needed to 100 mg by mouth two times daily. Tramadol may cause drowsiness. Counseled patient to not consume if operating heavy machinery or driving. Counseled patient to not consume with alcohol and illicit substances. Patient verbalized understanding. - I did check the [facility] prescription drug database and found no frequent prescribers of opiates or evidence of aberrant behavior. - Will need corticosteroid injections.  - Referral to Orthopedics for further  evaluation and management. - Follow-up with primary physician as needed. - traMADol (ULTRAM) 50 MG tablet; Take 2 tablets (100 mg total) by mouth 2 (two) times daily for 5 days.  Dispense: 20 tablet; Refill: 0 - diclofenac Sodium (VOLTAREN) 1 % GEL; Apply 4 g topically 4 (four) times daily.  Dispense: 100 g; Refill: 1 - Ambulatory referral to Orthopedic Surgery  2. Tuberculosis screening: - Today reports he is beginning new job. States they require PPD. Reports he is allergic to PPD and will need chest x-ray instead.  - DG Abd 2 Views; Future  Patient was given the opportunity to ask questions.  Patient verbalized understanding of the plan and was able to repeat key elements of the plan. Patient was given clear instructions to go to Emergency Department or return to medical center if symptoms don't improve, worsen, or new problems develop.The patient verbalized understanding.  Rema Fendt, NP

## 2020-04-30 ENCOUNTER — Ambulatory Visit: Payer: Self-pay | Admitting: Surgical

## 2020-05-02 MED FILL — traMADol HCL 50 MG TABS: 50 | 5 days supply | Qty: 20 | Fill #0

## 2020-05-02 MED FILL — DICLOFENAC SODIUM 1% GEL: 1 | 6 days supply | Qty: 100 | Fill #0

## 2020-05-17 ENCOUNTER — Ambulatory Visit: Payer: Self-pay | Admitting: Surgical

## 2020-05-27 ENCOUNTER — Encounter (HOSPITAL_COMMUNITY): Payer: Self-pay | Admitting: Emergency Medicine

## 2020-05-27 ENCOUNTER — Emergency Department (HOSPITAL_COMMUNITY)
Admission: EM | Admit: 2020-05-27 | Discharge: 2020-05-27 | Disposition: A | Discharge status: Home / Self Care | Payer: Self-pay | Attending: Emergency Medicine | Admitting: Emergency Medicine

## 2020-05-27 ENCOUNTER — Other Ambulatory Visit: Payer: Self-pay

## 2020-05-27 DIAGNOSIS — R519 Headache, unspecified: Secondary | ICD-10-CM | POA: Insufficient documentation

## 2020-05-27 DIAGNOSIS — N189 Chronic kidney disease, unspecified: Secondary | ICD-10-CM | POA: Insufficient documentation

## 2020-05-27 DIAGNOSIS — G8929 Other chronic pain: Secondary | ICD-10-CM | POA: Insufficient documentation

## 2020-05-27 DIAGNOSIS — Z79899 Other long term (current) drug therapy: Secondary | ICD-10-CM | POA: Insufficient documentation

## 2020-05-27 DIAGNOSIS — I129 Hypertensive chronic kidney disease with stage 1 through stage 4 chronic kidney disease, or unspecified chronic kidney disease: Secondary | ICD-10-CM | POA: Insufficient documentation

## 2020-05-27 DIAGNOSIS — E860 Dehydration: Secondary | ICD-10-CM | POA: Insufficient documentation

## 2020-05-27 DIAGNOSIS — R55 Syncope and collapse: Secondary | ICD-10-CM | POA: Insufficient documentation

## 2020-05-27 DIAGNOSIS — F17210 Nicotine dependence, cigarettes, uncomplicated: Secondary | ICD-10-CM | POA: Insufficient documentation

## 2020-05-27 LAB — BASIC METABOLIC PANEL
Anion gap: 12 (ref 5–15)
BUN: 9 mg/dL (ref 6–20)
CO2: 23 mmol/L (ref 22–32)
Calcium: 8.3 mg/dL — ABNORMAL LOW (ref 8.9–10.3)
Chloride: 106 mmol/L (ref 98–111)
Creatinine, Ser: 1.25 mg/dL — ABNORMAL HIGH (ref 0.61–1.24)
GFR calc Af Amer: >60 mL/min (ref >60)
GFR calc non Af Amer: >60 mL/min (ref >60)
Glucose, Bld: 50 mg/dL — ABNORMAL LOW (ref 70–99)
Potassium: 4.5 mmol/L (ref 3.5–5.1)
Sodium: 141 mmol/L (ref 135–145)

## 2020-05-27 LAB — URINALYSIS, ROUTINE W REFLEX MICROSCOPIC
Bilirubin Urine: NEGATIVE
Glucose, UA: NEGATIVE mg/dL
Hgb urine dipstick: NEGATIVE
Ketones, ur: 80 mg/dL — AB
Leukocytes,Ua: NEGATIVE
Nitrite: NEGATIVE
Protein, ur: NEGATIVE mg/dL
Specific Gravity, Urine: 1.019 (ref 1.005–1.030)
pH: 5 (ref 5.0–8.0)

## 2020-05-27 LAB — CBC
HCT: 49.1 % (ref 39.0–52.0)
Hemoglobin: 15.4 g/dL (ref 13.0–17.0)
MCH: 30.1 pg (ref 26.0–34.0)
MCHC: 31.4 g/dL (ref 30.0–36.0)
MCV: 96.1 fL (ref 80.0–100.0)
Platelets: 199 10*3/uL (ref 150–400)
RBC: 5.11 MIL/uL (ref 4.22–5.81)
RDW: 13.3 % (ref 11.5–15.5)
WBC: 7.3 10*3/uL (ref 4.0–10.5)
nRBC: 0 % (ref 0.0–0.2)

## 2020-05-27 LAB — CBG MONITORING, ED
Glucose-Capillary: 116 mg/dL — ABNORMAL HIGH (ref 70–99)
Glucose-Capillary: 107 mg/dL — ABNORMAL HIGH (ref 70–99)

## 2020-05-27 MED ORDER — LACTATED RINGERS IV BOLUS: 1000.0000 mL | Freq: Once | INTRAVENOUS | Status: DC

## 2020-05-27 MED ORDER — LACTATED RINGERS IV BOLUS
1000.0000 mL | Freq: Once | INTRAVENOUS | Status: AC
  Administered 2020-05-27: 1000 mL via INTRAVENOUS

## 2020-05-27 NOTE — ED Triage Notes (Signed)
C/o generalized weakness, body aches, and dizziness that started while walking to the store this morning.  No neuro deficits noted.

## 2020-05-27 NOTE — ED Notes (Signed)
Pt given a [place] sandwich and a sprite.
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
## 2020-05-30 ENCOUNTER — Ambulatory Visit: Payer: Self-pay | Admitting: Orthopedic Surgery

## 2021-07-31 ENCOUNTER — Other Ambulatory Visit: Payer: Self-pay

## 2021-07-31 MED ORDER — IBUPROFEN 400 MG PO TABS
400.0000 mg | ORAL_TABLET | Freq: Four times a day (QID) | ORAL | 0 refills | Status: DC
  Filled 2021-07-31: qty 16, 2d supply, fill #0

## 2021-07-31 MED ORDER — AMOXICILLIN 500 MG PO CAPS
1000.0000 mg | ORAL_CAPSULE | ORAL | 0 refills | Status: DC
  Filled 2021-07-31: qty 30, 8d supply, fill #0

## 2021-09-11 ENCOUNTER — Ambulatory Visit (INDEPENDENT_AMBULATORY_CARE_PROVIDER_SITE_OTHER): Payer: Self-pay | Admitting: Clinical

## 2021-09-11 ENCOUNTER — Other Ambulatory Visit: Payer: Self-pay

## 2021-09-11 DIAGNOSIS — F1220 Cannabis dependence, uncomplicated: Secondary | ICD-10-CM

## 2021-09-11 DIAGNOSIS — F1420 Cocaine dependence, uncomplicated: Secondary | ICD-10-CM

## 2021-09-11 DIAGNOSIS — F1020 Alcohol dependence, uncomplicated: Secondary | ICD-10-CM

## 2021-09-11 NOTE — Progress Notes (Signed)
Comprehensive Clinical Assessment (CCA) Note  09/11/2021 Phillip Oliver [id number]  Chief Complaint:  Chief Complaint  Patient presents with   Addiction Problem   Visit Diagnosis:  Cocaine use disorder, moderate Cannabis use disorder, moderate Alcohol use disorder, moderate   Interpretive Summary:   Client is a 56 year old male presenting to the [facility] as a walk-in for outpatient services. Client presented with his fiance for the assessment. Client reported he is referred by [facility] recovery Services for a clinical assessment. Client reported he is presenting due to reoccurring substance use history. Client reported use of alcohol, marijuana, and cocaine. Client reported his use became a problem starting over 30 years ago. Client reported completing a 28 day residential treatment in [place] approximately 10 years ago. Client reported he saw psychiatrist at the age of 34 or 14 for a reason he cannot recall. Client denied other mental health treatments since then.  Client reported he uses alcohol, marijuana, and cocaine as frequently as he has the money to do so.  Client reported his trigger for use is feeling angry. Client reported he would often intentionally cause arguments so he could go and use the illicit substances. Client denied symptoms of depression and anxiety. Client reported his priority is working on getting into detox and residential treatment for his substance abuse because he is at the point of "losing it all". Client reported his substance use negatively impacts his relationships and was recently lost his insurance due to being laid off of his job after testing positive for marijuana in November 2022. Client was told he could come back to work if he sought out treatment. Client presented oriented x5, appropriately dressed, and friendly.  Client denied hallucinations, delusions, suicidal and homicidal ideations.  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  Client declined therapy and psychiatry services at [facility] at this time.  Therapist provided information on format of appointment (virtual or face to face).   The client was advised to call back or seek an in-person evaluation if the symptoms worsen or if the condition fails to improve as anticipated before the next scheduled appointment. Client was in agreement with treatment recommendations.    CCA Biopsychosocial Intake/Chief Complaint:  Client reported he is presenting due to reoccuring substance use including alcohol, cocaine, and marijuana use.  Current Symptoms/Problems: Client reported daily use of illict substances. Client denied mental health symptoms.  Patient Reported Schizophrenia/Schizoaffective Diagnosis in Past: No  Strengths: family support  Type of Services Patient Feels are Needed: detox treatment  Initial Clinical Notes/Concerns: No data recorded  Mental Health Symptoms Depression:   None   Duration of Depressive symptoms:  No data recorded  Mania:   None   Anxiety:    None   Psychosis:   None   Duration of Psychotic symptoms: No data recorded  Trauma:   None   Obsessions:   None   Compulsions:   None   Inattention:   None    Hyperactivity/Impulsivity:   None   Oppositional/Defiant Behaviors:   None   Emotional Irregularity:   None   Other Mood/Personality Symptoms:  No data recorded   Mental Status Exam Appearance and self-care  Stature:   Tall   Weight:   Average weight   Clothing:   Casual   Grooming:   Normal   Cosmetic use:   Age appropriate   Posture/gait:   Normal   Motor activity:   Not Remarkable   Sensorium  Attention:   Normal   Concentration:   Normal   Orientation:   X5   Recall/memory:   Normal   Affect and Mood  Affect:   Congruent   Mood:   Euthymic   Relating  Eye contact:   Normal   Facial expression:   Responsive   Attitude toward examiner:   Cooperative   Thought and Language  Speech flow:  Clear and Coherent   Thought content:   Appropriate to Mood and Circumstances   Preoccupation:   None   Hallucinations:   None   Organization:  No data recorded  [employer] of Knowledge:   Fair   Intelligence:   Average   Abstraction:   Normal   Judgement:   Fair   Art therapist:   Adequate   Insight:   Fair   Decision Making:   Normal   Social Functioning  Social Maturity:   Responsible   Social Judgement:   Normal   Stress  Stressors:   Work   Coping Ability:   Optician, dispensing Deficits:   Self-control; Self-care   Supports:   Family; Friends/Service system     Religion: Religion/Spirituality Are You A Religious Person?: No  Leisure/Recreation: Leisure / Recreation Do You Have Hobbies?: No  Exercise/Diet: Exercise/Diet Do You Exercise?: No Have You Gained or Lost A Significant Amount of Weight in the Past Six Months?: No Do You Follow a Special Diet?: No Do You Have Any Trouble Sleeping?: No   CCA Employment/Education Employment/Work Situation: Employment / Work Situation Employment Situation: Unemployed Patient's Job has Been Impacted by Current Illness: Yes Describe  how Patient's Job has Been Impacted: Client reported working with the same company for 2 years. Client reported he was laid off due to have a positive drug test.  Education: Education Did Teacher, adult education From [employer]?: Yes   CCA Family/Childhood History Family and Relationship History: Family history Marital status: Long term relationship Long term relationship, how long?: since 2017 Does patient have children?: Yes How many children?: 2  Childhood History:  Childhood History By whom was/is the patient raised?: Mother, Adoptive parents Additional childhood history information: Client reported he is from [place]. Client reported he was raised by his biological mother until she passed when he was 64 years old. Client repored he stayed with his mothers sister and her children after she passed. Does patient have siblings?: Yes Number of Siblings: 7 Did patient suffer any verbal/emotional/physical/sexual abuse as a child?: Yes Did patient suffer from severe childhood neglect?: Yes Patient description of severe childhood neglect: Client reported when he lived with his aunt he believed he recieved unfair and  verbal and physcial punishment for things his cousins did. Has patient ever been sexually abused/assaulted/raped as an adolescent or adult?: No Was the patient ever a victim of a crime or a disaster?: No Witnessed domestic violence?: No Has patient been affected by domestic violence as an adult?: No  Child/Adolescent Assessment:     CCA Substance Use Alcohol/Drug Use: Alcohol / Drug Use History of alcohol / drug use?: Yes Substance #1 Name of Substance 1: Cocaine 1 - Age of First Use: 30 years ago 1 - Amount (size/oz): UTA 1 - Frequency: weekly 1 - Last Use / Amount: 2 weeks ago 1 - Method of Aquiring: illegaly 1- Route of Use: inhalation Substance #2 Name of Substance 2: Marijuana 2 - Age of First Use: 30 years ago 2 - Amount (size/oz): $20/ day 2 - Frequency:  Daily 2 - Last Use / Amount: 09/10/21 2 - Method of Aquiring: illegaly 2 - Route of Substance Use: smoking Substance #3 Name of Substance 3: Alcohol 3 - Age of First Use: 8 3 - Amount (size/oz): 6 beers 3 - Frequency: daily 3 - Last Use / Amount: 09/09/21 3 - Method of Aquiring: himself 3 - Route of Substance Use: smoking                   ASAM's:  Six Dimensions of Multidimensional Assessment  Dimension 1:  Acute Intoxication and/or Withdrawal Potential:   Dimension 1:  Description of individual's past and current experiences of substance use and withdrawal: Client reported residential treatment 10 years ago  Dimension 2:  Biomedical Conditions and Complications:   Dimension 2:  Description of patient's biomedical conditions and  complications: Client reported no medical conditions  Dimension 3:  Emotional, Behavioral, or Cognitive Conditions and Complications:  Dimension 3:  Description of emotional, behavioral, or cognitive conditions and complications: Client denied history of mental health symptoms.  Dimension 4:  Readiness to Change:  Dimension 4:  Description of Readiness to Change criteria: Client is in the action stage of change  Dimension 5:  Relapse, Continued use, or Continued Problem Potential:  Dimension 5:  Relapse, continued use, or continued problem potential critiera description: Client reported last use being 09/10/21  Dimension 6:  Recovery/Living Environment:  Dimension 6:  Recovery/Iiving environment criteria description: Client reported he has positive support from his fiance  ASAM Severity Score: ASAM's Severity Rating Score: 6  ASAM Recommended Level of Treatment: ASAM Recommended Level of Treatment: Level III Residential Treatment   Substance use Disorder (SUD) Substance Use Disorder (SUD)  Checklist Symptoms of Substance Use: Evidence of tolerance, Presence of craving or strong urge to use, Continued use despite persistent or recurrent social, interpersonal  problems, caused or exacerbated by use, Persistent desire or unsuccessful efforts to cut down or control use, Continued use despite having a persistent/recurrent physical/psychological problem caused/exacerbated by use  Recommendations for Services/Supports/Treatments: Recommendations for Services/Supports/Treatments Recommendations For Services/Supports/Treatments: Detox  DSM5 Diagnoses: Patient Active Problem List   Diagnosis Date Noted   Chronic kidney disease 05/03/2018   Polysubstance abuse ([place]) 12/14/2017   Syncope 08/14/2017   Hyperlipidemia 02/25/2016   Osteoarthritis 02/22/2016   Erectile dysfunction 02/22/2016   Essential hypertension 12/24/2015   Tooth ache 12/24/2015   Tobacco use disorder 12/24/2015    Patient Centered Plan: Patient is on the following Treatment Plan(s):  Impulse Control   Referrals to Alternative Service(s): Referred to Alternative Service(s):   Place:   Date:   Time:    Referred to Alternative Service(s):  Place:   Date:   Time:    Referred to Alternative Service(s):   Place:   Date:   Time:    Referred to Alternative Service(s):   Place:   Date:   Time:     Bernestine Amass, LCSW

## 2021-09-16 ENCOUNTER — Encounter: Payer: Self-pay | Admitting: Family Medicine

## 2021-09-27 ENCOUNTER — Ambulatory Visit (HOSPITAL_COMMUNITY)
Admission: RE | Admit: 2021-09-27 | Discharge: 2021-09-27 | Disposition: A | Discharge status: Home / Self Care | Payer: Self-pay | Attending: Endocrinology | Admitting: Endocrinology

## 2021-09-27 NOTE — H&P (Signed)
Behavioral Health Medical Screening Exam  Phillip Oliver is an 56 y.o. male who presents to [facility] voluntarily for assessment of substance abuse with his girlfriend who is present during assessment. Patient states he's currently seeking detox and rehabilitation service for substance abuse; endorses daily use of alcohol and marijuana, states he uses cocaine "occasionally". Reports last drink "this morning", last use of cocaine "about 2-3 weeks ago".   Patient reports feeling like he is "spiraling" since losing job in November 2022 after testing positive for substances. Currently unemployed; states girlfriend "bank" supports him financially. Per chart review patient seen at [facility] 09/11/20 for cocaine use disorder where it is noted patient declined services and was discharged with outpatient resources; patient denies declining services and states he was told to "come over here" (to [facility]) after his courtdate by [facility] staff.   He denies any suicidal or homicidal ideations, auditory or visual hallucinations, and does not appear to be actively psychotic or under the influence at this time. No noted tremors or mood fluctuations; patient denies any tremor, nausea or vomiting, headache, or other withdrawal symptoms at this time. Provider discussed local detox and residential treatment facilities in which patient states he was interested in. Girlfriend mentioned knowledge of [facility] residential program located on resources provided and states they will follow up with that program; of note patient overheard asking girlfriend to take him to [facility] while provider walked group to lobby.   Total Time spent with patient: 15 minutes  Psychiatric Specialty Exam: Physical Exam Vitals and nursing note reviewed.  Constitutional:      Appearance: He is normal weight.  HENT:     Head: Normocephalic.     Nose: Nose normal.     Mouth/Throat:     Mouth: Mucous membranes are moist.     Pharynx: Oropharynx is clear.  Eyes:      Pupils: Pupils are equal, round, and reactive to light.  Cardiovascular:     Rate and Rhythm: Normal rate.     Pulses: Normal pulses.  Pulmonary:     Effort: Pulmonary effort is normal.  Abdominal:     General: Abdomen is flat.  Musculoskeletal:        General: Normal range of motion.     Cervical back: Normal range of motion.  Skin:    General: Skin is warm and dry.  Neurological:     General: No focal deficit present.     Mental Status: He is alert and oriented to person, place, and time. Mental status is at baseline.  Psychiatric:        Attention and Perception: Attention and perception normal.        Mood and Affect: Mood and affect normal.        Speech: Speech normal.        Behavior: Behavior normal. Behavior is cooperative.        Thought Content: Thought content normal.        Cognition and Memory: Cognition and memory normal.        Judgment: Judgment normal.   Review of Systems  Psychiatric/Behavioral:  Negative for self-injury, sleep disturbance and suicidal ideas.   All other systems reviewed and are negative. Blood pressure (!) 133/103, pulse 91, temperature 98 F (36.7 C), temperature source Oral, resp. rate 18, SpO2 100 %.There is no height or weight on file to calculate BMI. General Appearance: Fairly Groomed Eye Contact:  Good Speech:  Clear and Coherent Volume:  Normal Mood:  Euthymic  Affect:  Congruent Thought Process:  Goal Directed Orientation:  Full (Time, Place, and Person) Thought Content:  WDL Suicidal Thoughts:  No Homicidal Thoughts:  No Memory:  Immediate;   Fair Recent;   Fair Remote;   Fair Judgement:  Fair Insight:  Fair Psychomotor Activity:  Normal Concentration: Concentration: Fair and Attention Span: Fair Recall:  [employer] of Knowledge:Fair Language: Good Akathisia:  NA Handed:  Right AIMS (if indicated):    Assets:  Housing Intimacy Physical Health Resilience Social Support Sleep:     Musculoskeletal: Strength &  Muscle Tone: within normal limits Gait & Station: normal Patient leans: N/A  Blood pressure (!) 133/103, pulse 91, temperature 98 F (36.7 C), temperature source Oral, resp. rate 18, SpO2 100 %.  Recommendations: Based on my evaluation the patient does not appear to have an emergency medical condition. Patient provided resources to local detox and rehabilitation centers.   Loletta Parish, NP 09/27/2021, 12:34 PM
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
## 2021-12-22 ENCOUNTER — Other Ambulatory Visit: Payer: Self-pay

## 2021-12-22 ENCOUNTER — Encounter (HOSPITAL_COMMUNITY): Payer: Self-pay | Admitting: Emergency Medicine

## 2021-12-22 ENCOUNTER — Emergency Department (HOSPITAL_COMMUNITY)
Admission: EM | Admit: 2021-12-22 | Discharge: 2021-12-22 | Disposition: A | Discharge status: Home / Self Care | Payer: 59 | Attending: Emergency Medicine | Admitting: Emergency Medicine

## 2021-12-22 ENCOUNTER — Emergency Department (HOSPITAL_COMMUNITY): Payer: 59

## 2021-12-22 DIAGNOSIS — S7002XA Contusion of left hip, initial encounter: Secondary | ICD-10-CM | POA: Insufficient documentation

## 2021-12-22 DIAGNOSIS — S79912A Unspecified injury of left hip, initial encounter: Secondary | ICD-10-CM | POA: Diagnosis present

## 2021-12-22 MED ORDER — TRAMADOL HCL 50 MG PO TABS
50.0000 mg | ORAL_TABLET | Freq: Four times a day (QID) | ORAL | 0 refills | Status: DC | PRN
  Filled 2021-12-22: qty 12, 3d supply, fill #0
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
## 2021-12-22 NOTE — Discharge Instructions (Signed)
Return for any problem.  ?
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
## 2021-12-22 NOTE — ED Notes (Signed)
Patient Alert and oriented to baseline. Stable and ambulatory to baseline. Patient verbalized understanding of the discharge instructions.  Patient belongings were taken by the patient.   
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
## 2021-12-23 ENCOUNTER — Other Ambulatory Visit: Payer: Self-pay

## 2021-12-25 ENCOUNTER — Other Ambulatory Visit: Payer: Self-pay

## 2021-12-26 ENCOUNTER — Other Ambulatory Visit: Payer: Self-pay

## 2022-01-08 ENCOUNTER — Ambulatory Visit: Payer: Self-pay

## 2022-01-08 NOTE — Telephone Encounter (Signed)
Contacted pt to schedule an appt. Was unable to reach pt due to call can't be completed at this time  ?

## 2022-01-08 NOTE — Telephone Encounter (Signed)
? ?  Chief Complaint: Has been out of BP medication 1-2 years. BP 120/100. ?Symptoms: No symptoms. "I can't donate platelets with my BP high." ?Frequency: 1-2 weeks ?Pertinent Negatives: Patient denies symptoms. "I feel fine." Instructed to go to ED if symptoms occur. ?Disposition: [] ED /[] Urgent Care (no appt availability in office) / [] Appointment(In office/virtual)/ []  [facility]/ [] Home Care/ [] Refused Recommended Disposition /[]  Mobile Bus/ [x]  Follow-up with PCP ?Additional Notes: Currently working out of town. Asking for appointment in June or July. Please call wife, Amy, with appointment. [phone].  ?Answer Assessment - Initial Assessment Questions ?1. BLOOD PRESSURE: "What is the blood pressure?" "Did you take at least two measurements 5 minutes apart?" ?    120/100 ?2. ONSET: "When did you take your blood pressure?" ?    1-2 weeks ago ?3. HOW: "How did you obtain the blood pressure?" (e.g., visiting nurse, automatic home BP monitor) ?    Blood donation ?4. HISTORY: "Do you have a history of high blood pressure?" ?    Yes ?5. MEDICATIONS: "Are you taking any medications for blood pressure?" "Have you missed any doses recently?" ?    Has been out of BP medication ?6. OTHER SYMPTOMS: "Do you have any symptoms?" (e.g., headache, chest pain, blurred vision, difficulty breathing, weakness) ?    Feels fine ?7. PREGNANCY: "Is there any chance you are pregnant?" "When was your last menstrual period?" ?    N/a ? ?Protocols used: Blood Pressure - High-A-AH ? ?

## 2022-02-17 ENCOUNTER — Ambulatory Visit: Payer: Self-pay | Admitting: Family Medicine

## 2022-02-19 ENCOUNTER — Other Ambulatory Visit: Payer: Self-pay | Admitting: Pharmacist

## 2022-02-19 ENCOUNTER — Ambulatory Visit: Payer: Self-pay | Attending: Family Medicine | Admitting: Family Medicine

## 2022-02-19 ENCOUNTER — Other Ambulatory Visit: Payer: Self-pay

## 2022-02-19 ENCOUNTER — Encounter: Payer: Self-pay | Admitting: Family Medicine

## 2022-02-19 VITALS — BP 141/97 | HR 74 | Temp 98.2°F | Ht 79.0 in | Wt 237.4 lb

## 2022-02-19 DIAGNOSIS — R29818 Other symptoms and signs involving the nervous system: Secondary | ICD-10-CM

## 2022-02-19 DIAGNOSIS — Z1211 Encounter for screening for malignant neoplasm of colon: Secondary | ICD-10-CM

## 2022-02-19 DIAGNOSIS — F419 Anxiety disorder, unspecified: Secondary | ICD-10-CM

## 2022-02-19 DIAGNOSIS — E7849 Other hyperlipidemia: Secondary | ICD-10-CM

## 2022-02-19 DIAGNOSIS — I10 Essential (primary) hypertension: Secondary | ICD-10-CM

## 2022-02-19 MED ORDER — ATORVASTATIN CALCIUM 20 MG PO TABS
20.0000 mg | ORAL_TABLET | Freq: Every day | ORAL | 3 refills | Status: AC
  Filled 2022-02-19: qty 30, 30d supply, fill #0
  Filled 2022-10-10 – 2022-10-23 (×2): qty 30, 30d supply, fill #1

## 2022-02-19 MED ORDER — AMLODIPINE BESYLATE 10 MG PO TABS
10.0000 mg | ORAL_TABLET | Freq: Every day | ORAL | 3 refills | Status: DC
  Filled 2022-02-19: qty 30, 30d supply, fill #0

## 2022-02-19 MED ORDER — FLUOXETINE HCL 20 MG PO CAPS
20.0000 mg | ORAL_CAPSULE | Freq: Every day | ORAL | 3 refills | Status: AC
  Filled 2022-02-19: qty 30, 30d supply, fill #0
  Filled 2022-10-10 – 2022-10-23 (×2): qty 30, 30d supply, fill #1

## 2022-02-19 NOTE — Patient Instructions (Signed)

## 2022-02-19 NOTE — Chronic Care Management (AMB) (Signed)
Patient seen by Jacolyn Reedy, PharmD Candidate on 02/19/22 while they were picking up prescriptions at [facility] at [facility].   Patient does not have an automated home blood pressure machine.  The following barriers to adherence were noted: - Denies concerns with medication access or understanding.  The following interventions were completed:  - Medications were reviewed - Patient was counseled on lifestyle modifications to improve blood pressure  The patient has follow up scheduled:  PCP: 03/26/22   Catie Eppie Gibson, PharmD, [facility] [phone]
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
## 2022-02-20 LAB — CMP14+EGFR
ALT: 37 IU/L (ref 0–44)
AST: 48 IU/L — ABNORMAL HIGH (ref 0–40)
Albumin/Globulin Ratio: 1.3 (ref 1.2–2.2)
Albumin: 4.2 g/dL (ref 3.8–4.9)
Alkaline Phosphatase: 74 IU/L (ref 44–121)
BUN/Creatinine Ratio: 6 — ABNORMAL LOW (ref 9–20)
BUN: 6 mg/dL (ref 6–24)
Bilirubin Total: 0.4 mg/dL (ref 0.0–1.2)
CO2: 22 mmol/L (ref 20–29)
Calcium: 9.1 mg/dL (ref 8.7–10.2)
Chloride: 104 mmol/L (ref 96–106)
Creatinine, Ser: 1.03 mg/dL (ref 0.76–1.27)
Globulin, Total: 3.3 g/dL (ref 1.5–4.5)
Glucose: 79 mg/dL (ref 70–99)
Potassium: 4.4 mmol/L (ref 3.5–5.2)
Sodium: 140 mmol/L (ref 134–144)
Total Protein: 7.5 g/dL (ref 6.0–8.5)
eGFR: 85 mL/min/{1.73_m2} (ref >59)

## 2022-03-04 ENCOUNTER — Telehealth: Payer: Self-pay

## 2022-03-04 NOTE — Telephone Encounter (Signed)
Patient returned our call. Shared provider's note with pt. No questions. Hoy Register, MD  02/20/2022  8:38 AM EDT     Please inform the patient that labs are stable compared to previous labs

## 2022-03-26 ENCOUNTER — Ambulatory Visit: Payer: Self-pay | Admitting: Family Medicine

## 2022-05-26 ENCOUNTER — Encounter (HOSPITAL_COMMUNITY): Payer: Self-pay

## 2022-05-26 ENCOUNTER — Other Ambulatory Visit: Payer: Self-pay

## 2022-05-26 ENCOUNTER — Emergency Department (HOSPITAL_COMMUNITY)
Admission: EM | Admit: 2022-05-26 | Discharge: 2022-05-26 | Disposition: A | Discharge status: Home / Self Care | Payer: Commercial Managed Care - HMO | Attending: Emergency Medicine | Admitting: Emergency Medicine

## 2022-05-26 DIAGNOSIS — Z79899 Other long term (current) drug therapy: Secondary | ICD-10-CM | POA: Diagnosis not present

## 2022-05-26 DIAGNOSIS — K0889 Other specified disorders of teeth and supporting structures: Secondary | ICD-10-CM

## 2022-05-26 DIAGNOSIS — I10 Essential (primary) hypertension: Secondary | ICD-10-CM | POA: Insufficient documentation

## 2022-05-26 MED ORDER — IBUPROFEN 600 MG PO TABS
600.0000 mg | ORAL_TABLET | Freq: Four times a day (QID) | ORAL | 0 refills | Status: DC | PRN
  Filled 2022-05-26: qty 30, 8d supply, fill #0

## 2022-05-26 MED ORDER — AMOXICILLIN-POT CLAVULANATE 875-125 MG PO TABS
1.0000 | ORAL_TABLET | Freq: Once | ORAL | Status: AC
  Administered 2022-05-26: 1 via ORAL
  Filled 2022-05-26: qty 1

## 2022-05-26 MED ORDER — AMLODIPINE BESYLATE 5 MG PO TABS
5.0000 mg | ORAL_TABLET | Freq: Once | ORAL | Status: AC
  Administered 2022-05-26: 5 mg via ORAL
  Filled 2022-05-26: qty 1

## 2022-05-26 MED ORDER — IBUPROFEN 200 MG PO TABS
600.0000 mg | ORAL_TABLET | Freq: Once | ORAL | Status: AC
  Administered 2022-05-26: 600 mg via ORAL
  Filled 2022-05-26: qty 3

## 2022-05-26 MED ORDER — AMOXICILLIN-POT CLAVULANATE 875-125 MG PO TABS
1.0000 | ORAL_TABLET | Freq: Two times a day (BID) | ORAL | 0 refills | Status: DC
  Filled 2022-05-26: qty 13, 7d supply, fill #0

## 2022-05-26 MED ORDER — AMLODIPINE BESYLATE 10 MG PO TABS
10.0000 mg | ORAL_TABLET | Freq: Every day | ORAL | 1 refills | Status: DC
  Filled 2022-05-26: qty 30, 30d supply, fill #0
  Filled 2022-07-04: qty 30, 30d supply, fill #1

## 2022-05-26 NOTE — Discharge Instructions (Addendum)
Return to the ED with any new symptoms such as fevers, nausea or vomiting, trouble swallowing Please refer to the attached resource guide for dentists in the area that you can follow-up with. Please read the attached guide concerning dental pain. Please continue taking ibuprofen every 6 hours for relief of dental pain.  If you begin to develop abdominal pain, blood in stool please discontinue use of ibuprofen. Please begin taking antibiotic for the neck 7 days.  You will take this twice daily.  You have already been given your first dose here this morning so you will only need to take 1 more dose this afternoon.

## 2022-05-26 NOTE — ED Provider Notes (Signed)
[facility] DEPT Provider Note   CSN: [id number] Arrival date & time: 05/26/22  [id number]     History  Chief Complaint  Patient presents with   Dental Pain    Phillip Oliver is a 56 y.o. male with medical history of hypertension, hyperlipidemia.  The patient presents to the ED for evaluation of right-sided upper dental pain.  Patient reports that the dental pain began this morning.  Patient states that he has not seen a dentist in "25 years".  Patient denies any nausea, vomiting, fevers, body aches or chills, trouble swallowing.  The patient is also noted to be hypertensive with a blood pressure of 195/123.  The patient denies any chest pain, shortness of breath, headache, blurred vision.  The patient denies taking blood pressure medication at home, states that he has been out of it for quite some time.   Dental Pain Associated symptoms: no fever and no headaches        Home Medications Prior to Admission medications   Medication Sig Start Date End Date Taking? Authorizing Provider  amLODipine (NORVASC) 10 MG tablet Take 1 tablet (10 mg total) by mouth daily. 05/26/22  Yes Azucena Cecil, PA-C  amoxicillin-clavulanate (AUGMENTIN) 875-125 MG tablet Take 1 tablet by mouth every 12 (twelve) hours. 05/26/22  Yes Azucena Cecil, PA-C  ibuprofen (ADVIL) 600 MG tablet Take 1 tablet (600 mg total) by mouth every 6 (six) hours as needed. 05/26/22  Yes Azucena Cecil, PA-C  atorvastatin (LIPITOR) 20 MG tablet Take 1 tablet (20 mg total) by mouth once daily. 02/19/22   Charlott Rakes, MD  cyclobenzaprine (FLEXERIL) 10 MG tablet Take 1 tablet (10 mg total) by mouth 3 (three) times daily as needed for muscle spasms. 12/05/19   Mayers, Cari S, PA-C  diclofenac sodium (VOLTAREN) 1 % GEL Apply 4 g topically 4 (four) times daily. Patient not taking: Reported on 02/19/2022 12/06/18   Charlott Rakes, MD  diclofenac Sodium (VOLTAREN) 1 % GEL Apply 4 g topically 4  (four) times daily. Patient not taking: Reported on 02/19/2022 04/24/20   Camillia Herter, NP  FLUoxetine (PROZAC) 20 MG capsule Take 1 capsule (20 mg total) by mouth once daily. 02/19/22   Charlott Rakes, MD  hydrALAZINE (APRESOLINE) 25 MG tablet Take 1 tablet (25 mg total) by mouth 2 (two) times daily. 12/05/19   Mayers, Cari S, PA-C  lisinopril (ZESTRIL) 40 MG tablet Take 1 tablet (40 mg total) by mouth daily. 12/05/19   Mayers, Loraine Grip, PA-C  meloxicam (MOBIC) 15 MG tablet 1 po q d 11/02/19   Meredith Pel, MD  methocarbamol (ROBAXIN) 500 MG tablet Take 1 tablet (500 mg total) by mouth every 8 (eight) hours as needed for muscle spasms. 05/11/19   Charlott Rakes, MD  tadalafil (CIALIS) 10 MG tablet Take 1 tablet (10 mg total) by mouth every other day as needed for erectile dysfunction. 08/12/19   Charlott Rakes, MD  traMADol (ULTRAM) 50 MG tablet Take 1 tablet (50 mg total) by mouth every 6 (six) hours as needed. 12/22/21   Valarie Merino, MD      Allergies    Albuterol sulfate, Florastor [yeast], and Tylenol [acetaminophen]    Review of Systems   Review of Systems  Constitutional:  Negative for chills and fever.  HENT:  Positive for dental problem. Negative for trouble swallowing.   Eyes:  Negative for visual disturbance.  Respiratory:  Negative for shortness of breath.   Cardiovascular:  Negative for chest pain.  Gastrointestinal:  Negative for nausea and vomiting.  Neurological:  Negative for headaches.  All other systems reviewed and are negative.   Physical Exam Updated Vital Signs BP (!) 195/123 (BP Location: Right Arm)   Pulse 71   Temp 98.4 F (36.9 C) (Oral)   Resp 18   Ht 6\' 7"  (2.007 m)   Wt 106.6 kg   SpO2 100%   BMI 26.47 kg/m  Physical Exam Vitals and nursing note reviewed.  Constitutional:      General: He is not in acute distress.    Appearance: Normal appearance. He is not ill-appearing, toxic-appearing or diaphoretic.  HENT:     Head: Normocephalic and  atraumatic.     Nose: Nose normal. No congestion.     Mouth/Throat:     Mouth: Mucous membranes are moist.     Pharynx: Oropharynx is clear.   Eyes:     Extraocular Movements: Extraocular movements intact.     Conjunctiva/sclera: Conjunctivae normal.     Pupils: Pupils are equal, round, and reactive to light.  Cardiovascular:     Rate and Rhythm: Normal rate and regular rhythm.  Pulmonary:     Effort: Pulmonary effort is normal.     Breath sounds: Normal breath sounds. No wheezing.  Abdominal:     General: Abdomen is flat. Bowel sounds are normal.     Palpations: Abdomen is soft.     Tenderness: There is no abdominal tenderness.  Musculoskeletal:     Cervical back: Normal range of motion and neck supple. No tenderness.  Skin:    General: Skin is warm and dry.     Capillary Refill: Capillary refill takes less than 2 seconds.  Neurological:     Mental Status: He is alert and oriented to person, place, and time.     ED Results / Procedures / Treatments   Labs (all labs ordered are listed, but only abnormal results are displayed) Labs Reviewed - No data to display  EKG None  Radiology No results found.  Procedures Procedures   Medications Ordered in ED Medications  amLODipine (NORVASC) tablet 5 mg (5 mg Oral Given 05/26/22 0723)  amoxicillin-clavulanate (AUGMENTIN) 875-125 MG per tablet 1 tablet (1 tablet Oral Given 05/26/22 0723)  ibuprofen (ADVIL) tablet 600 mg (600 mg Oral Given 05/26/22 0741)    ED Course/ Medical Decision Making/ A&P                           Medical Decision Making Risk OTC drugs. Prescription drug management.   56 year old male presents to the ED for evaluation.  Please see HPI for further details.  On examination the patient is afebrile and nontachycardic.  The patient lung sounds are clear bilaterally.  Patient is not hypoxic on room air.  Patient abdomen is soft and compressible all 4 quadrants.  The patient neurological examination  shows no focal neurodeficits.  Patient oropharynx inspected without findings of abscess in need of draining.  The patient is handling secretions appropriately.  There is no drooling.  The patient does have a broken off second premolar on the upper right side consistent with patient's story and location of pain.  The patient will be placed on 7 days of Augmentin, advised to begin taking ibuprofen for pain relief and given a list of dental resources to follow-up with in the area.  The patient will be advised to return to the ED with any new  symptom such as fevers, nausea or vomiting, trouble swallowing.  The patient voices understanding of my instructions.  The patient has had all of his questions answered to his satisfaction prior to discharge.  The patient stable at this time for discharge home.  Final Clinical Impression(s) / ED Diagnoses Final diagnoses:  Pain, dental    Rx / DC Orders ED Discharge Orders          Ordered    ibuprofen (ADVIL) 600 MG tablet  Every 6 hours PRN        05/26/22 0741    amoxicillin-clavulanate (AUGMENTIN) 875-125 MG tablet  Every 12 hours        05/26/22 0741    amLODipine (NORVASC) 10 MG tablet  Daily        05/26/22 0741              Azucena Cecil, PA-C 05/26/22 0805    Regan Lemming, MD 05/26/22 1454

## 2022-05-26 NOTE — ED Triage Notes (Signed)
C/o facial pain and swelling that started this morning. Sts this pain is relieved by cold water. Reports hx dental abscesses. Hypertensive 195/123. Pt sts doesn't know last time he took his bp medicine. Uncomfortable appearing. Nadn.

## 2022-05-27 ENCOUNTER — Encounter (HOSPITAL_COMMUNITY): Payer: Self-pay

## 2022-05-27 ENCOUNTER — Emergency Department (HOSPITAL_COMMUNITY): Payer: Commercial Managed Care - HMO

## 2022-05-27 ENCOUNTER — Emergency Department (HOSPITAL_COMMUNITY)
Admission: EM | Admit: 2022-05-27 | Discharge: 2022-05-27 | Disposition: A | Discharge status: Home / Self Care | Payer: Commercial Managed Care - HMO | Attending: Emergency Medicine | Admitting: Emergency Medicine

## 2022-05-27 ENCOUNTER — Other Ambulatory Visit: Payer: Self-pay

## 2022-05-27 DIAGNOSIS — K0889 Other specified disorders of teeth and supporting structures: Secondary | ICD-10-CM | POA: Insufficient documentation

## 2022-05-27 DIAGNOSIS — R55 Syncope and collapse: Secondary | ICD-10-CM | POA: Diagnosis present

## 2022-05-27 LAB — CBC WITH DIFFERENTIAL/PLATELET
Abs Immature Granulocytes: 0.04 10*3/uL (ref 0.00–0.07)
Basophils Absolute: 0 10*3/uL (ref 0.0–0.1)
Basophils Relative: 0 %
Eosinophils Absolute: 0 10*3/uL (ref 0.0–0.5)
Eosinophils Relative: 1 %
HCT: 52.6 % — ABNORMAL HIGH (ref 39.0–52.0)
Hemoglobin: 17.3 g/dL — ABNORMAL HIGH (ref 13.0–17.0)
Immature Granulocytes: 1 %
Lymphocytes Relative: 19 %
Lymphs Abs: 0.9 10*3/uL (ref 0.7–4.0)
MCH: 31.1 pg (ref 26.0–34.0)
MCHC: 32.9 g/dL (ref 30.0–36.0)
MCV: 94.6 fL (ref 80.0–100.0)
Monocytes Absolute: 0.5 10*3/uL (ref 0.1–1.0)
Monocytes Relative: 11 %
Neutro Abs: 3.3 10*3/uL (ref 1.7–7.7)
Neutrophils Relative %: 68 %
Platelets: 149 10*3/uL — ABNORMAL LOW (ref 150–400)
RBC: 5.56 MIL/uL (ref 4.22–5.81)
RDW: 13.6 % (ref 11.5–15.5)
WBC: 4.8 10*3/uL (ref 4.0–10.5)
nRBC: 0 % (ref 0.0–0.2)

## 2022-05-27 LAB — BASIC METABOLIC PANEL
Anion gap: 13 (ref 5–15)
BUN: 10 mg/dL (ref 6–20)
CO2: 21 mmol/L — ABNORMAL LOW (ref 22–32)
Calcium: 9 mg/dL (ref 8.9–10.3)
Chloride: 101 mmol/L (ref 98–111)
Creatinine, Ser: 1.39 mg/dL — ABNORMAL HIGH (ref 0.61–1.24)
GFR, Estimated: 59 mL/min — ABNORMAL LOW (ref >60)
Glucose, Bld: 99 mg/dL (ref 70–99)
Potassium: 3.7 mmol/L (ref 3.5–5.1)
Sodium: 135 mmol/L (ref 135–145)

## 2022-05-27 LAB — CBG MONITORING, ED: Glucose-Capillary: 106 mg/dL — ABNORMAL HIGH (ref 70–99)

## 2022-05-27 LAB — TROPONIN I (HIGH SENSITIVITY)
Troponin I (High Sensitivity): 5 ng/L (ref <18)
Troponin I (High Sensitivity): 5 ng/L (ref <18)

## 2022-05-27 MED ORDER — SODIUM CHLORIDE 0.9 % IV BOLUS
1000.0000 mL | Freq: Once | INTRAVENOUS | Status: AC
  Administered 2022-05-27: 1000 mL via INTRAVENOUS

## 2022-05-27 NOTE — Discharge Instructions (Signed)
Eat and drink as well as you can.  Please follow-up with your family doctor in the office.

## 2022-05-27 NOTE — ED Provider Notes (Signed)
[facility] EMERGENCY DEPARTMENT Provider Note   CSN: [id number] Arrival date & time: 05/27/22  0757     History  Chief Complaint  Patient presents with   Loss of Consciousness    Phillip Oliver is a 56 y.o. male.  56 yo M with a chief complaints of a syncopal event.  The patient tells me that he was at work and he collapsed.  It sounds like he just started this job.  He was moving some pieces of metal when he felt like his eyes were irritated and woke up on the ground.  He thinks that he hurt the right side of his neck during the fall.  He denies any other injury.  He denies chest pain denies difficulty breathing denies headache or neck pain prior to the event.  Denies abdominal pain.  He tells me he has not had anything to eat or drink for about 48 hours due to dental pain.  Was seen recently in the emergency department and started on antibiotics.  He thinks that his dental pain has improved since.   Loss of Consciousness      Home Medications Prior to Admission medications   Medication Sig Start Date End Date Taking? Authorizing Provider  amLODipine (NORVASC) 10 MG tablet Take 1 tablet (10 mg total) by mouth daily. 05/26/22   Azucena Cecil, PA-C  amoxicillin-clavulanate (AUGMENTIN) 875-125 MG tablet Take 1 tablet by mouth every 12 (twelve) hours. 05/26/22   Azucena Cecil, PA-C  atorvastatin (LIPITOR) 20 MG tablet Take 1 tablet (20 mg total) by mouth once daily. 02/19/22   Charlott Rakes, MD  cyclobenzaprine (FLEXERIL) 10 MG tablet Take 1 tablet (10 mg total) by mouth 3 (three) times daily as needed for muscle spasms. 12/05/19   Mayers, Cari S, PA-C  diclofenac sodium (VOLTAREN) 1 % GEL Apply 4 g topically 4 (four) times daily. Patient not taking: Reported on 02/19/2022 12/06/18   Charlott Rakes, MD  diclofenac Sodium (VOLTAREN) 1 % GEL Apply 4 g topically 4 (four) times daily. Patient not taking: Reported on 02/19/2022 04/24/20   Camillia Herter, NP   FLUoxetine (PROZAC) 20 MG capsule Take 1 capsule (20 mg total) by mouth once daily. 02/19/22   Charlott Rakes, MD  hydrALAZINE (APRESOLINE) 25 MG tablet Take 1 tablet (25 mg total) by mouth 2 (two) times daily. 12/05/19   Mayers, Cari S, PA-C  ibuprofen (ADVIL) 600 MG tablet Take 1 tablet (600 mg total) by mouth every 6 (six) hours as needed. 05/26/22   Azucena Cecil, PA-C  lisinopril (ZESTRIL) 40 MG tablet Take 1 tablet (40 mg total) by mouth daily. 12/05/19   Mayers, Loraine Grip, PA-C  meloxicam (MOBIC) 15 MG tablet 1 po q d 11/02/19   Meredith Pel, MD  methocarbamol (ROBAXIN) 500 MG tablet Take 1 tablet (500 mg total) by mouth every 8 (eight) hours as needed for muscle spasms. 05/11/19   Charlott Rakes, MD  tadalafil (CIALIS) 10 MG tablet Take 1 tablet (10 mg total) by mouth every other day as needed for erectile dysfunction. 08/12/19   Charlott Rakes, MD  traMADol (ULTRAM) 50 MG tablet Take 1 tablet (50 mg total) by mouth every 6 (six) hours as needed. 12/22/21   Valarie Merino, MD      Allergies    Albuterol sulfate, Florastor [yeast], and Tylenol [acetaminophen]    Review of Systems   Review of Systems  Cardiovascular:  Positive for syncope.    Physical  Exam Updated Vital Signs BP (!) 169/110   Pulse 79   Temp 98.7 F (37.1 C) (Oral)   Resp 16   Ht 6\' 7"  (2.007 m)   Wt 106.6 kg   SpO2 98%   BMI 26.47 kg/m  Physical Exam Vitals and nursing note reviewed.  Constitutional:      Appearance: He is well-developed.  HENT:     Head: Normocephalic and atraumatic.  Eyes:     Pupils: Pupils are equal, round, and reactive to light.  Neck:     Vascular: No JVD.  Cardiovascular:     Rate and Rhythm: Normal rate and regular rhythm.     Heart sounds: No murmur heard.    No friction rub. No gallop.  Pulmonary:     Effort: No respiratory distress.     Breath sounds: No wheezing.  Abdominal:     General: There is no distension.     Tenderness: There is no abdominal  tenderness. There is no guarding or rebound.  Musculoskeletal:        General: Tenderness present. Normal range of motion.     Cervical back: Normal range of motion and neck supple.     Comments: Tenderness about the right trapezius muscle belly.  Pulse motor and sensation intact distally.  No obvious midline spinal tenderness step-offs or deformities.  Skin:    Coloration: Skin is not pale.     Findings: No rash.  Neurological:     Mental Status: He is alert and oriented to person, place, and time.  Psychiatric:        Behavior: Behavior normal.     ED Results / Procedures / Treatments   Labs (all labs ordered are listed, but only abnormal results are displayed) Labs Reviewed  CBC WITH DIFFERENTIAL/PLATELET - Abnormal; Notable for the following components:      Result Value   Hemoglobin 17.3 (*)    HCT 52.6 (*)    Platelets 149 (*)    All other components within normal limits  BASIC METABOLIC PANEL - Abnormal; Notable for the following components:   CO2 21 (*)    Creatinine, Ser 1.39 (*)    GFR, Estimated 59 (*)    All other components within normal limits  CBG MONITORING, ED - Abnormal; Notable for the following components:   Glucose-Capillary 106 (*)    All other components within normal limits  TROPONIN I (HIGH SENSITIVITY)  TROPONIN I (HIGH SENSITIVITY)    EKG EKG Interpretation  Date/Time:  Tuesday May 27 2022 08:35:17 EDT Ventricular Rate:  73 PR Interval:  134 QRS Duration: 93 QT Interval:  418 QTC Calculation: 461 R Axis:   58 Text Interpretation: Sinus rhythm Abnormal T, consider ischemia, lateral leads Minimal ST elevation, anterior leads morphology similar to prior though st segments  more pronounced in the inferior leads Confirmed by Deno Etienne [id number]) on 05/27/2022 9:14:50 AM  Radiology CT Head Wo Contrast  Result Date: 05/27/2022 CLINICAL DATA:  Head trauma, abnormal mental status (Age 42-64y); Neck trauma, intoxicated or obtunded (Age >=  16y) EXAM: CT HEAD WITHOUT CONTRAST CT CERVICAL SPINE WITHOUT CONTRAST TECHNIQUE: Multidetector CT imaging of the head and cervical spine was performed following the standard protocol without intravenous contrast. Multiplanar CT image reconstructions of the cervical spine were also generated. RADIATION DOSE REDUCTION: This exam was performed according to the departmental dose-optimization program which includes automated exposure control, adjustment of the mA and/or kV according to patient size and/or use of iterative reconstruction  technique. COMPARISON:  None Available. FINDINGS: CT HEAD FINDINGS Brain: No evidence of acute infarction, hemorrhage, hydrocephalus, extra-axial collection or mass lesion/mass effect. Vascular: No hyperdense vessel. Skull: No acute fracture. Sinuses/Orbits: Remote left medial orbital wall fracture. Largely clear sinuses. Other: No mastoid effusions. CT CERVICAL SPINE FINDINGS Alignment: Straightening.  No substantial sagittal subluxation. Skull base and vertebrae: Vertebral body heights are maintained. No evidence of acute fracture Soft tissues and spinal canal: No prevertebral fluid or swelling. No visible canal hematoma. Disc levels: Moderate disc degenerative change at C5-C6 with left eccentric posterior disc/osteophyte complex resulting in at least moderate and potentially severe canal stenosis. Upper chest: Visualized lung apices are clear. Other: Calcific atherosclerosis IMPRESSION: 1. No evidence of acute intracranial abnormality. 2. No evidence of acute fracture or traumatic malalignment in the cervical spine. 3. Moderate disc degenerative change at C5-C6 with left eccentric posterior disc/osteophyte complex resulting in at least moderate and potentially severe canal stenosis. An MRI could better characterize the canal/cord if clinically warranted. Electronically Signed   By: Margaretha Sheffield M.D.   On: 05/27/2022 09:05   CT Cervical Spine Wo Contrast  Result Date:  05/27/2022 CLINICAL DATA:  Head trauma, abnormal mental status (Age 45-64y); Neck trauma, intoxicated or obtunded (Age >= 16y) EXAM: CT HEAD WITHOUT CONTRAST CT CERVICAL SPINE WITHOUT CONTRAST TECHNIQUE: Multidetector CT imaging of the head and cervical spine was performed following the standard protocol without intravenous contrast. Multiplanar CT image reconstructions of the cervical spine were also generated. RADIATION DOSE REDUCTION: This exam was performed according to the departmental dose-optimization program which includes automated exposure control, adjustment of the mA and/or kV according to patient size and/or use of iterative reconstruction technique. COMPARISON:  None Available. FINDINGS: CT HEAD FINDINGS Brain: No evidence of acute infarction, hemorrhage, hydrocephalus, extra-axial collection or mass lesion/mass effect. Vascular: No hyperdense vessel. Skull: No acute fracture. Sinuses/Orbits: Remote left medial orbital wall fracture. Largely clear sinuses. Other: No mastoid effusions. CT CERVICAL SPINE FINDINGS Alignment: Straightening.  No substantial sagittal subluxation. Skull base and vertebrae: Vertebral body heights are maintained. No evidence of acute fracture Soft tissues and spinal canal: No prevertebral fluid or swelling. No visible canal hematoma. Disc levels: Moderate disc degenerative change at C5-C6 with left eccentric posterior disc/osteophyte complex resulting in at least moderate and potentially severe canal stenosis. Upper chest: Visualized lung apices are clear. Other: Calcific atherosclerosis IMPRESSION: 1. No evidence of acute intracranial abnormality. 2. No evidence of acute fracture or traumatic malalignment in the cervical spine. 3. Moderate disc degenerative change at C5-C6 with left eccentric posterior disc/osteophyte complex resulting in at least moderate and potentially severe canal stenosis. An MRI could better characterize the canal/cord if clinically warranted.  Electronically Signed   By: Margaretha Sheffield M.D.   On: 05/27/2022 09:05    Procedures Procedures    Medications Ordered in ED Medications  sodium chloride 0.9 % bolus 1,000 mL (0 mLs Intravenous Stopped 05/27/22 1024)    ED Course/ Medical Decision Making/ A&P                           Medical Decision Making Amount and/or Complexity of Data Reviewed Labs: ordered. Radiology: ordered. ECG/medicine tests: ordered.   56 yo M with a chief complaints of a syncopal event.  This occurred just prior to arrival.  He was at work and had collapsed.  He endorses that he has not had anything to eat or drink in about 48 hours.  Could be dehydration as a possible cause of his symptoms.  He was complaining of dental pain is the reason why he has anything to eat or drink.  That is improved.  No signs of Ludwig's angina.  His EKG has some subtle changes, looks fairly similar to baseline but will obtain 2 troponins with a history of cocaine abuse.  Patient also seems to be somewhat intoxicated initially on exam.  Will obtain a CT scan of the head and C-spine.  CT head was negative for acute intracranial pathology.  CT of the C-spine without acute injury.  Patient's blood work is reassuring.  2 troponins are negative.  Will discharge home.  PCP follow-up.  1:38 PM:  I have discussed the diagnosis/risks/treatment options with the patient.  Evaluation and diagnostic testing in the emergency department does not suggest an emergent condition requiring admission or immediate intervention beyond what has been performed at this time.  They will follow up with  PCP. We also discussed returning to the ED immediately if new or worsening sx occur. We discussed the sx which are most concerning (e.g., sudden worsening pain, fever, inability to tolerate by mouth) that necessitate immediate return. Medications administered to the patient during their visit and any new prescriptions provided to the patient are listed  below.  Medications given during this visit Medications  sodium chloride 0.9 % bolus 1,000 mL (0 mLs Intravenous Stopped 05/27/22 1024)     The patient appears reasonably screen and/or stabilized for discharge and I doubt any other medical condition or other [facility] requiring further screening, evaluation, or treatment in the ED at this time prior to discharge.          Final Clinical Impression(s) / ED Diagnoses Final diagnoses:  Syncope and collapse    Rx / DC Orders ED Discharge Orders     None         Deno Etienne, DO 05/27/22 1338

## 2022-05-27 NOTE — ED Triage Notes (Signed)
Pt BIB [facility] from work c/o a syncopal episode. Pt stated he did fall back into a cleaning solution called Muriatic acid. Pt is c/o left eye burning and neck pain. Per EMS the hazmat team came out and stated the acid was pH neutral and should not be causing his eye to burn. EMS irrigated his eye for safe measures. Pt initially was 90/60 after getting 200 NS pt's BP was 103/72.

## 2022-05-28 ENCOUNTER — Ambulatory Visit (HOSPITAL_COMMUNITY)
Admission: EM | Admit: 2022-05-28 | Discharge: 2022-05-28 | Disposition: A | Discharge status: Home / Self Care | Payer: Commercial Managed Care - HMO | Attending: Family Medicine | Admitting: Family Medicine

## 2022-05-28 ENCOUNTER — Ambulatory Visit: Payer: Self-pay | Admitting: *Deleted

## 2022-05-28 ENCOUNTER — Encounter (HOSPITAL_COMMUNITY): Payer: Self-pay | Admitting: Emergency Medicine

## 2022-05-28 DIAGNOSIS — T2692XA Corrosion of left eye and adnexa, part unspecified, initial encounter: Secondary | ICD-10-CM | POA: Diagnosis not present
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
## 2022-05-28 NOTE — ED Triage Notes (Signed)
Pt presents with left eye burning and irritation  since yesterday. States he was seen in the ED yesterday after a syncopal episode and getting a cleaning solution into his eye. States eye was washed out 1.5 hours after getting to the hospital. C/o blurry vision and burning sensation.

## 2022-05-29 ENCOUNTER — Other Ambulatory Visit: Payer: Self-pay

## 2022-05-29 MED ORDER — ERYTHROMYCIN 5 MG/GM OP OINT
TOPICAL_OINTMENT | OPHTHALMIC | 1 refills | Status: AC
  Filled 2022-05-29: qty 3.5, 23d supply, fill #0

## 2022-06-04 ENCOUNTER — Other Ambulatory Visit: Payer: Self-pay

## 2022-06-28 ENCOUNTER — Emergency Department (HOSPITAL_COMMUNITY)
Admission: EM | Admit: 2022-06-28 | Discharge: 2022-06-29 | Disposition: A | Discharge status: Home / Self Care | Payer: Commercial Managed Care - HMO | Attending: Emergency Medicine | Admitting: Emergency Medicine

## 2022-06-28 ENCOUNTER — Other Ambulatory Visit: Payer: Self-pay

## 2022-06-28 ENCOUNTER — Encounter (HOSPITAL_COMMUNITY): Payer: Self-pay | Admitting: Emergency Medicine

## 2022-06-28 ENCOUNTER — Emergency Department (HOSPITAL_COMMUNITY): Payer: Commercial Managed Care - HMO

## 2022-06-28 DIAGNOSIS — I10 Essential (primary) hypertension: Secondary | ICD-10-CM | POA: Insufficient documentation

## 2022-06-28 DIAGNOSIS — Z79899 Other long term (current) drug therapy: Secondary | ICD-10-CM | POA: Insufficient documentation

## 2022-06-28 DIAGNOSIS — R1013 Epigastric pain: Secondary | ICD-10-CM | POA: Insufficient documentation

## 2022-06-28 DIAGNOSIS — R110 Nausea: Secondary | ICD-10-CM | POA: Insufficient documentation

## 2022-06-28 DIAGNOSIS — R079 Chest pain, unspecified: Secondary | ICD-10-CM | POA: Insufficient documentation

## 2022-06-28 LAB — URINALYSIS, ROUTINE W REFLEX MICROSCOPIC
Bilirubin Urine: NEGATIVE
Glucose, UA: NEGATIVE mg/dL
Hgb urine dipstick: NEGATIVE
Ketones, ur: 20 mg/dL — AB
Leukocytes,Ua: NEGATIVE
Nitrite: NEGATIVE
Protein, ur: NEGATIVE mg/dL
Specific Gravity, Urine: 1.018 (ref 1.005–1.030)
pH: 5 (ref 5.0–8.0)

## 2022-06-28 LAB — CBC
HCT: 46.1 % (ref 39.0–52.0)
Hemoglobin: 15.2 g/dL (ref 13.0–17.0)
MCH: 30.6 pg (ref 26.0–34.0)
MCHC: 33 g/dL (ref 30.0–36.0)
MCV: 92.9 fL (ref 80.0–100.0)
Platelets: 191 10*3/uL (ref 150–400)
RBC: 4.96 MIL/uL (ref 4.22–5.81)
RDW: 13.9 % (ref 11.5–15.5)
WBC: 3.9 10*3/uL — ABNORMAL LOW (ref 4.0–10.5)
nRBC: 0 % (ref 0.0–0.2)

## 2022-06-28 LAB — COMPREHENSIVE METABOLIC PANEL
ALT: 12 U/L (ref 0–44)
AST: 24 U/L (ref 15–41)
Albumin: 3.7 g/dL (ref 3.5–5.0)
Alkaline Phosphatase: 56 U/L (ref 38–126)
Anion gap: 11 (ref 5–15)
BUN: 11 mg/dL (ref 6–20)
CO2: 24 mmol/L (ref 22–32)
Calcium: 8.6 mg/dL — ABNORMAL LOW (ref 8.9–10.3)
Chloride: 105 mmol/L (ref 98–111)
Creatinine, Ser: 1.05 mg/dL (ref 0.61–1.24)
GFR, Estimated: >60 mL/min (ref >60)
Glucose, Bld: 86 mg/dL (ref 70–99)
Potassium: 4.3 mmol/L (ref 3.5–5.1)
Sodium: 140 mmol/L (ref 135–145)
Total Bilirubin: 0.5 mg/dL (ref 0.3–1.2)
Total Protein: 7.3 g/dL (ref 6.5–8.1)

## 2022-06-28 LAB — TROPONIN I (HIGH SENSITIVITY)
Troponin I (High Sensitivity): 8 ng/L (ref <18)
Troponin I (High Sensitivity): 7 ng/L (ref <18)

## 2022-06-28 LAB — LIPASE, BLOOD: Lipase: 26 U/L (ref 11–51)

## 2022-06-28 MED ORDER — FAMOTIDINE 20 MG PO TABS
20.0000 mg | ORAL_TABLET | Freq: Once | ORAL | Status: DC
  Filled 2022-06-28: qty 1

## 2022-06-28 MED ORDER — LIDOCAINE VISCOUS HCL 2 % MT SOLN: 15.0000 mL | Freq: Once | OROMUCOSAL | Status: DC

## 2022-06-28 MED ORDER — ALUM & MAG HYDROXIDE-SIMETH 200-200-20 MG/5ML PO SUSP
30.0000 mL | Freq: Once | ORAL | Status: DC
  Filled 2022-06-28: qty 30

## 2022-06-28 NOTE — ED Provider Triage Note (Signed)
Emergency Medicine Provider Triage Evaluation Note  Phillip Oliver , a 56 y.o. male  was evaluated in triage.  Pt complains of chest pain, nausea.  Patient states his symptoms began around 930 this morning when he was working.  He notes persistence of symptoms since onset.  He has no associated shortness of breath.  Reports he is drinking alcohol yesterday as well as using marijuana.  Notes no episodes of emesis.  There is no cardiac or pulmonary history.  Denies fever, chills, night sweats, cough, congestion, urinary symptoms, change in bowel habits..  Review of Systems  Positive: See above Negative:   Physical Exam  BP (!) 144/109   Pulse 90   Temp 97.7 F (36.5 C) (Oral)   Resp 20   SpO2 100%  Gen:   Awake, no distress   Resp:  Normal effort  MSK:   Moves extremities without difficulty  Other:  No obvious murmurs gallops or rubs.  Lungs clear to auscultation bilaterally.  Epigastric tenderness to palpation.  Medical Decision Making  Medically screening exam initiated at 12:19 PM.  Appropriate orders placed.  Phillip Oliver was informed that the remainder of the evaluation will be completed by another provider, this initial triage assessment does not replace that evaluation, and the importance of remaining in the ED until their evaluation is complete.     Wilnette Kales, [place] 06/28/22 1222

## 2022-06-28 NOTE — ED Provider Notes (Signed)
[facility]PART[facility] Provider Note   CSN: [id number] Arrival date & time: 06/28/22  1149     History {Add pertinent medical, surgical, social history, OB history to HPI:1} Chief Complaint  Patient presents with   Chest Pain    Phillip Oliver is a 56 y.o. male.  HPI     Home Medications Prior to Admission medications   Medication Sig Start Date End Date Taking? Authorizing Provider  amLODipine (NORVASC) 10 MG tablet Take 1 tablet (10 mg total) by mouth daily. 05/26/22   Al Decant, PA-C  amoxicillin-clavulanate (AUGMENTIN) 875-125 MG tablet Take 1 tablet by mouth every 12 (twelve) hours. 05/26/22   Al Decant, PA-C  atorvastatin (LIPITOR) 20 MG tablet Take 1 tablet (20 mg total) by mouth once daily. 02/19/22   Hoy Register, MD  cyclobenzaprine (FLEXERIL) 10 MG tablet Take 1 tablet (10 mg total) by mouth 3 (three) times daily as needed for muscle spasms. 12/05/19   Mayers, Cari S, PA-C  diclofenac sodium (VOLTAREN) 1 % GEL Apply 4 g topically 4 (four) times daily. Patient not taking: Reported on 02/19/2022 12/06/18   Hoy Register, MD  diclofenac Sodium (VOLTAREN) 1 % GEL Apply 4 g topically 4 (four) times daily. Patient not taking: Reported on 02/19/2022 04/24/20   Rema Fendt, NP  erythromycin ophthalmic ointment Apply to left eye 3 times daily. 05/28/22   Antony Contras, MD  FLUoxetine (PROZAC) 20 MG capsule Take 1 capsule (20 mg total) by mouth once daily. 02/19/22   Hoy Register, MD  hydrALAZINE (APRESOLINE) 25 MG tablet Take 1 tablet (25 mg total) by mouth 2 (two) times daily. 12/05/19   Mayers, Cari S, PA-C  ibuprofen (ADVIL) 600 MG tablet Take 1 tablet (600 mg total) by mouth every 6 (six) hours as needed. 05/26/22   Al Decant, PA-C  lisinopril (ZESTRIL) 40 MG tablet Take 1 tablet (40 mg total) by mouth daily. 12/05/19   Mayers, Kasandra Knudsen, PA-C  meloxicam (MOBIC) 15 MG tablet 1 po q d 11/02/19   Cammy Copa, MD   methocarbamol (ROBAXIN) 500 MG tablet Take 1 tablet (500 mg total) by mouth every 8 (eight) hours as needed for muscle spasms. 05/11/19   Hoy Register, MD  tadalafil (CIALIS) 10 MG tablet Take 1 tablet (10 mg total) by mouth every other day as needed for erectile dysfunction. 08/12/19   Hoy Register, MD  traMADol (ULTRAM) 50 MG tablet Take 1 tablet (50 mg total) by mouth every 6 (six) hours as needed. 12/22/21   Wynetta Fines, MD      Allergies    Albuterol sulfate, Florastor [yeast], and Tylenol [acetaminophen]    Review of Systems   Review of Systems  Physical Exam Updated Vital Signs BP (!) 165/91 (BP Location: Left Arm)   Pulse 65   Temp 98.3 F (36.8 C)   Resp 16   SpO2 100%  Physical Exam  ED Results / Procedures / Treatments   Labs (all labs ordered are listed, but only abnormal results are displayed) Labs Reviewed  CBC - Abnormal; Notable for the following components:      Result Value   WBC 3.9 (*)    All other components within normal limits  COMPREHENSIVE METABOLIC PANEL - Abnormal; Notable for the following components:   Calcium 8.6 (*)    All other components within normal limits  URINALYSIS, ROUTINE W REFLEX MICROSCOPIC - Abnormal; Notable for the following components:   Ketones, ur  20 (*)    All other components within normal limits  LIPASE, BLOOD  TROPONIN I (HIGH SENSITIVITY)  TROPONIN I (HIGH SENSITIVITY)    EKG EKG Interpretation  Date/Time:  Saturday June 28 2022 12:05:18 EDT Ventricular Rate:  73 PR Interval:  144 QRS Duration: 86 QT Interval:  436 QTC Calculation: 480 R Axis:   62 Text Interpretation: Normal sinus rhythm Cannot rule out Anterior infarct , age undetermined Abnormal ECG When compared with ECG of 27-May-2022 08:35, Confirmed by Margaretmary Eddy [phone]) on 06/28/2022 7:20:19 PM  Radiology DG Chest 2 View  Result Date: 06/28/2022 CLINICAL DATA:  Acute chest pain. EXAM: CHEST - 2 VIEW COMPARISON:  01/07/2016 and prior  studies FINDINGS: The cardiomediastinal silhouette is unremarkable. There is no evidence of focal airspace disease, pulmonary edema, suspicious pulmonary nodule/mass, pleural effusion, or pneumothorax. No acute bony abnormalities are identified. IMPRESSION: No active cardiopulmonary disease. Electronically Signed   By: Margarette Canada M.D.   On: 06/28/2022 12:49    Procedures Procedures  {Document cardiac monitor, telemetry assessment procedure when appropriate:1}  Medications Ordered in ED Medications  famotidine (PEPCID) tablet 20 mg (has no administration in time range)  alum & mag hydroxide-simeth (MAALOX/MYLANTA) 200-200-20 MG/5ML suspension 30 mL (has no administration in time range)    And  lidocaine (XYLOCAINE) 2 % viscous mouth solution 15 mL (has no administration in time range)    ED Course/ Medical Decision Making/ A&P                           Medical Decision Making Amount and/or Complexity of Data Reviewed Labs: ordered. Radiology: ordered.   ***  {Document critical care time when appropriate:1} {Document review of labs and clinical decision tools ie heart score, Chads2Vasc2 etc:1}  {Document your independent review of radiology images, and any outside records:1} {Document your discussion with family members, caretakers, and with consultants:1} {Document social determinants of health affecting pt's care:1} {Document your decision making why or why not admission, treatments were needed:1} Final Clinical Impression(s) / ED Diagnoses Final diagnoses:  None    Rx / DC Orders ED Discharge Orders     None

## 2022-06-28 NOTE — ED Notes (Signed)
Consulted with MD Philip Aspen about pt meds due since 1215 - per MD Philip Aspen hold meds for now, going to look at pt chart and go from there

## 2022-06-28 NOTE — ED Triage Notes (Signed)
Patient c/o of chest pain that started at 0930 today while at work that he states feels like a knot in his central chest down to his stomach. C/o feeling nauseated. Denies SHOB.  Aox4.

## 2022-06-28 NOTE — Discharge Instructions (Signed)
Today you were seen in the emergency department for your chest pain.    In the emergency department you had ekg and lab work that was reassuring.    At home, please take tylenol as needed for your pain.    Check your MyChart online for the results of any tests that had not resulted by the time you left the emergency department.   Follow-up with your primary doctor in 2-3 days regarding your visit.    Return immediately to the emergency department if you experience any of the following: worsening pain, difficulty breathing, or any other concerning symptoms.    Thank you for visiting our Emergency Department. It was a pleasure taking care of you today.

## 2022-06-29 MED ORDER — LIDOCAINE VISCOUS HCL 2 % MT SOLN
15.0000 mL | Freq: Once | OROMUCOSAL | Status: AC
  Administered 2022-06-29: 15 mL via ORAL
  Filled 2022-06-29: qty 15

## 2022-06-29 MED ORDER — FAMOTIDINE 20 MG PO TABS
20.0000 mg | ORAL_TABLET | Freq: Once | ORAL | Status: AC
  Administered 2022-06-29: 20 mg via ORAL
  Filled 2022-06-29: qty 1

## 2022-06-29 MED ORDER — ALUM & MAG HYDROXIDE-SIMETH 200-200-20 MG/5ML PO SUSP
30.0000 mL | Freq: Once | ORAL | Status: AC
  Administered 2022-06-29: 30 mL via ORAL
  Filled 2022-06-29: qty 30

## 2022-06-29 NOTE — ED Notes (Signed)
Patient verbalizes understanding of discharge instructions. Opportunity for questioning and answers were provided. Armband removed by staff, pt discharged from ED ambulatory.   

## 2022-07-04 ENCOUNTER — Other Ambulatory Visit: Payer: Self-pay

## 2022-07-07 ENCOUNTER — Other Ambulatory Visit: Payer: Self-pay

## 2022-07-10 ENCOUNTER — Other Ambulatory Visit: Payer: Self-pay

## 2022-07-25 ENCOUNTER — Ambulatory Visit: Payer: Commercial Managed Care - HMO | Admitting: Nurse Practitioner

## 2022-08-11 ENCOUNTER — Other Ambulatory Visit: Payer: Self-pay

## 2022-08-19 ENCOUNTER — Other Ambulatory Visit: Payer: Self-pay | Admitting: Family Medicine

## 2022-08-19 ENCOUNTER — Other Ambulatory Visit: Payer: Self-pay

## 2022-08-19 MED ORDER — AMLODIPINE BESYLATE 10 MG PO TABS
10.0000 mg | ORAL_TABLET | Freq: Every day | ORAL | 0 refills | Status: DC
  Filled 2022-08-19: qty 30, 30d supply, fill #0

## 2022-08-19 NOTE — Telephone Encounter (Signed)
Requested Prescriptions  Pending Prescriptions Disp Refills   amLODipine (NORVASC) 10 MG tablet 30 tablet 1    Sig: Take 1 tablet (10 mg total) by mouth daily.     Cardiovascular: Calcium Channel Blockers 2 Failed - 08/19/2022 11:33 AM      Failed - Last BP in normal range    BP Readings from Last 1 Encounters:  06/29/22 (!) 149/86         Failed - Valid encounter within last 6 months    Recent Outpatient Visits           6 months ago Screening for colon cancer   [facility], Odette Horns, MD   2 years ago Primary osteoarthritis of both knees   [facility] [place], [place] J, NP   2 years ago Acute midline thoracic back pain   [facility] [address] [facility] [place], Cari S, [place]   3 years ago Tendinopathy of left shoulder   [facility] [place], Odette Horns, MD   3 years ago Essential hypertension    [facility] Hoy Register, [facility]       Future Appointments             In 2 months Hoy Register, MD [facility]            Passed - Last Heart Rate in normal range    Pulse Readings from Last 1 Encounters:  06/29/22 73

## 2022-08-19 NOTE — Telephone Encounter (Signed)
Medication Refill - Medication: amLODipine (NORVASC) 10 MG tablet [[id number]]    Has the patient contacted their pharmacy? Yes.   (Agent: If no, request that the patient contact the pharmacy for the refill. If patient does not wish to contact the pharmacy document the reason why and proceed with request.) (Agent: If yes, when and what did the pharmacy advise?)pharmacy has sent several request with no response   Preferred Pharmacy (with phone number or street name): [facility] - [facility]  Has the patient been seen for an appointment in the last year OR does the patient have an upcoming appointment? Yes.    Agent: Please be advised that RX refills may take up to 3 business days. We ask that you follow-up with your pharmacy.

## 2022-08-20 ENCOUNTER — Other Ambulatory Visit: Payer: Self-pay

## 2022-08-20 ENCOUNTER — Ambulatory Visit: Payer: Commercial Managed Care - HMO | Attending: Physician Assistant | Admitting: Physician Assistant

## 2022-08-20 ENCOUNTER — Encounter: Payer: Self-pay | Admitting: Physician Assistant

## 2022-08-20 ENCOUNTER — Ambulatory Visit: Payer: Self-pay | Admitting: *Deleted

## 2022-08-20 VITALS — BP 143/95 | HR 77 | Wt 223.8 lb

## 2022-08-20 DIAGNOSIS — M25561 Pain in right knee: Secondary | ICD-10-CM | POA: Diagnosis not present

## 2022-08-20 DIAGNOSIS — I10 Essential (primary) hypertension: Secondary | ICD-10-CM | POA: Diagnosis not present

## 2022-08-20 MED ORDER — PREDNISONE 10 MG PO TABS
10.0000 mg | ORAL_TABLET | Freq: Every day | ORAL | 0 refills | Status: DC
  Filled 2022-08-20: qty 21, 6d supply, fill #0

## 2022-08-20 MED ORDER — DICLOFENAC SODIUM 1 % EX GEL
2.0000 g | Freq: Four times a day (QID) | CUTANEOUS | 1 refills | Status: AC
  Filled 2022-08-20: qty 100, fill #0
  Filled 2022-10-10 – 2022-10-23 (×2): qty 100, 25d supply, fill #0

## 2022-08-20 MED ORDER — AMLODIPINE BESYLATE 10 MG PO TABS
10.0000 mg | ORAL_TABLET | Freq: Every day | ORAL | 0 refills | Status: AC
  Filled 2022-10-10 – 2022-10-23 (×2): qty 30, 30d supply, fill #0

## 2022-08-20 MED ORDER — TRAMADOL HCL 50 MG PO TABS
50.0000 mg | ORAL_TABLET | Freq: Four times a day (QID) | ORAL | 0 refills | Status: AC | PRN
  Filled 2022-08-20: qty 12, 3d supply, fill #0

## 2022-08-20 NOTE — Telephone Encounter (Signed)
  Chief Complaint: right knee swollen Symptoms: right knee swelling, difficulty walking. Difficulty bending knee Frequency: 2-3 days  Pertinent Negatives: Patient denies fever, no redness reported  Disposition: [] ED /[] Urgent Care (no appt availability in office) / [x] Appointment(In office/virtual)/ []  [place]/ [] Home Care/ [] Refused Recommended Disposition /[] [place] [facility]/ []  Follow-up with PCP Additional Notes:   Appt scheduled for today     Reason for Disposition  MILD or MODERATE swelling (e.g., can't move joint normally, can't do usual activities) (Exceptions: Itchy, localized swelling; swelling is chronic.)  Answer Assessment - Initial Assessment Questions 1. LOCATION: "Where is the swelling located?"  (e.g., left, right, both knees)     Right knee 2. ONSET: "When did the swelling start?" "Does it come and go, or is it there all the time?"     2- 3 days ago  3. SWELLING: "How bad is the swelling?" Or, "How large is it?" (e.g., mild, moderate, severe; size of localized swelling)    - NONE: No joint swelling.   - LOCALIZED: Localized; small area of puffy or swollen skin (e.g., insect bite, skin irritation).   - MILD: Joint looks or feels mildly swollen or puffy.   - MODERATE: Swollen; interferes with normal activities (e.g., work or school); can't move joint normally (bend and straighten completely); may be limping.   - SEVERE: Very swollen; can't move swollen joint at all; limping a lot or unable to walk.     Walking with limp, requires assist to bend knee  4. PAIN: "Is there any pain?" If Yes, ask: "How bad is it?" (Scale 1-10; or mild, moderate, severe)   - NONE (0): no pain.   - MILD (1-3): doesn't interfere with normal activities.    - MODERATE (4-7): interferes with normal activities (e.g., work or school) or awakens from sleep, limping.    - SEVERE (8-10): excruciating pain, unable to do any normal activities, unable to walk.      Yes pain did not  rate pain  5. SETTING: "Has there been any recent work, exercise or other activity that involved that part of the body?"      No  6. AGGRAVATING FACTORS: "What makes the knee swelling worse?" (e.g., walking, climbing stairs, running)     Can walk up stairs with handrail 7. ASSOCIATED SYMPTOMS: "Is there any pain or redness?"     Tender feeling no redness noted 8. OTHER SYMPTOMS: "Do you have any other symptoms?" (e.g., chest pain, difficulty breathing, fever, calf pain)     Difficulty walking  9. PREGNANCY: "Is there any chance you are pregnant?" "When was your last menstrual period?"     na  Protocols used: Knee Swelling-A-AH
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
## 2022-09-05 ENCOUNTER — Ambulatory Visit: Payer: Commercial Managed Care - HMO | Admitting: Surgical

## 2022-10-01 ENCOUNTER — Encounter (HOSPITAL_COMMUNITY): Payer: Self-pay | Admitting: Emergency Medicine

## 2022-10-01 ENCOUNTER — Ambulatory Visit (HOSPITAL_COMMUNITY)
Admission: EM | Admit: 2022-10-01 | Discharge: 2022-10-01 | Disposition: A | Discharge status: Home / Self Care | Payer: Commercial Managed Care - HMO | Attending: Emergency Medicine | Admitting: Emergency Medicine

## 2022-10-01 ENCOUNTER — Ambulatory Visit: Payer: Self-pay

## 2022-10-01 DIAGNOSIS — M25461 Effusion, right knee: Secondary | ICD-10-CM

## 2022-10-01 DIAGNOSIS — M25561 Pain in right knee: Secondary | ICD-10-CM

## 2022-10-01 MED ORDER — KETOROLAC TROMETHAMINE 30 MG/ML IJ SOLN
INTRAMUSCULAR | Status: AC
  Filled 2022-10-01: qty 1

## 2022-10-01 MED ORDER — PREDNISONE 10 MG (21) PO TBPK
ORAL_TABLET | Freq: Every day | ORAL | 0 refills | Status: AC
  Filled 2022-10-01 – 2022-10-10 (×2): qty 21, 6d supply, fill #0

## 2022-10-01 MED ORDER — KETOROLAC TROMETHAMINE 30 MG/ML IJ SOLN
30.0000 mg | Freq: Once | INTRAMUSCULAR | Status: AC
  Administered 2022-10-01: 30 mg via INTRAMUSCULAR

## 2022-10-01 MED ORDER — CYCLOBENZAPRINE HCL 10 MG PO TABS
10.0000 mg | ORAL_TABLET | Freq: Two times a day (BID) | ORAL | 0 refills | Status: AC | PRN
  Filled 2022-10-01 – 2022-10-23 (×3): qty 20, 10d supply, fill #0

## 2022-10-01 NOTE — ED Triage Notes (Signed)
Pt reports that had issues with swelling in legs for years but gotten worse  in past couple days. Pt reports pain pill and work note. Tramadol isnt helping per patient.
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
## 2022-10-01 NOTE — Telephone Encounter (Signed)
Chief Complaint: Right Knee swelling and pain Symptoms: Pain 8/10, right knee worse than left, hard to walk due to pain Frequency: Onset past 2-3 days Pertinent Negatives: Patient denies leg pain Disposition: [] ED /[x] Urgent Care (no appt availability in office) / [] Appointment(In office/virtual)/ []  [facility]/ [] Home Care/ [] Refused Recommended Disposition /[] [facility]/ []  Follow-up with PCP Additional Notes: No availbility in the office, advised UC, unable to do a virtual UC visit.   Reason for Disposition  [1] SEVERE pain (e.g., excruciating, unable to walk) AND [2] not improved after 2 hours of pain medicine  Answer Assessment - Initial Assessment Questions 1. LOCATION: "Where is the swelling located?"  (e.g., left, right, both knees)     Right is worse than left 2. ONSET: "When did the swelling start?" "Does it come and go, or is it there all the time?"     2-3 days 3. SWELLING: "How bad is the swelling?" Or, "How large is it?" (e.g., mild, moderate, severe; size of localized swelling)    - NONE: No joint swelling.   - LOCALIZED: Localized; small area of puffy or swollen skin (e.g., insect bite, skin irritation).   - MILD: Joint looks or feels mildly swollen or puffy.   - MODERATE: Swollen; interferes with normal activities (e.g., work or school); can't move joint normally (bend and straighten completely); may be limping.   - SEVERE: Very swollen; can't move swollen joint at all; limping a lot or unable to walk.     Moderate 4. PAIN: "Is there any pain?" If Yes, ask: "How bad is it?" (Scale 1-10; or mild, moderate, severe)   - NONE (0): no pain.   - MILD (1-3): doesn't interfere with normal activities.    - MODERATE (4-7): interferes with normal activities (e.g., work or school) or awakens from sleep, limping.    - SEVERE (8-10): excruciating pain, unable to do any normal activities, unable to walk.      8 5. SETTING: "Has there been any recent work,  exercise or other activity that involved that part of the body?"      No 6. AGGRAVATING FACTORS: "What makes the knee swelling worse?" (e.g., walking, climbing stairs, running)     Walking makes the pain worse 7. ASSOCIATED SYMPTOMS: "Is there any pain or redness?"     Pain 8. OTHER SYMPTOMS: "Do you have any other symptoms?" (e.g., chest pain, difficulty breathing, fever, calf pain)     No  Protocols used: Knee Swelling-A-AH

## 2022-10-01 NOTE — Telephone Encounter (Signed)
Phone call placed to patient and attempted to give contact info for Ortho referral that was placed last month, patient states that he can not hear me and he will call back to get contact info.   [place] [phone]

## 2022-10-01 NOTE — Discharge Instructions (Signed)
Today will help manage her pain  You were given an injection of Toradol today here in the office to reduce inflammation and swelling which in turn should help with your pain, take Tylenol in addition to this at home  Starting tomorrow take prednisone every morning with food as directed to continue the above process, may take Tylenol 500 to 1000 mg every 6 hours in addition to this  You may use muscle relaxer twice a day as needed for additional comfort, be mindful this may make you feel drowsy.  You have been given a knee sleeve to add stability and support, use when working, may take off at rest  You may use ice or heat over the affected area in 10 to 15-minute intervals  May massage and stretch as tolerated  May complete activity as tolerated  As your doctor has already referred you out to orthopedics, if you are needing immediate evaluation you may follow-up at [facility] urgent care, as informational per page

## 2022-10-02 ENCOUNTER — Other Ambulatory Visit: Payer: Self-pay

## 2022-10-09 ENCOUNTER — Other Ambulatory Visit: Payer: Self-pay

## 2022-10-10 ENCOUNTER — Other Ambulatory Visit: Payer: Self-pay

## 2022-10-15 ENCOUNTER — Other Ambulatory Visit: Payer: Self-pay

## 2022-10-16 ENCOUNTER — Other Ambulatory Visit: Payer: Self-pay

## 2022-10-23 ENCOUNTER — Other Ambulatory Visit: Payer: Self-pay

## 2022-10-23 NOTE — Progress Notes (Signed)
Patient seen by Junius Finner, PharmD Candidate on 10/23/2022 while they were picking up prescriptions at [place] at [facility].   Blood pressure today was : 144/88, HR 74; on recheck (if >140 or >90): 144/86, HR 72    Patient does not have an automated home blood pressure machine. They would be interested if they could afford it at this time, but they are unable to currently.    Medication review was performed. They are not taking medications as prescribed. Differences from their prescribed list include: amlodipine BID when he remembers   The following barriers to adherence were noted:  - They do have cost concerns.  - They do have transportation concerns. Patient would like more information about transportation services.  - They do not need assistance obtaining refills.  - They do occasionally forget to take some of their prescribed medications. We set up an alarm on his phone each morning to remind him to take his medication.  - They do not feel like one/some of their medications make them feel poorly. He states he doesn't take his medicine enough for him to get side effects.  - They do not have questions or concerns about their medications.  - They do have follow up scheduled with their primary care provider/cardiologist.   The following interventions were completed:  - Medications were reviewed  - Patient was educated on medications, including indication and administration  - Patient was educated on non-pharmacological ways to decrease blood pressure, including limiting salt intake.  - Patient was educated on use of adherence strategies, like alarms. We set up a daily alarm on his phone to remind him to take his medicines.   The patient has follow up scheduled:  PCP: 10/27/2022 at 4:10 pm with Dr. Neysa Bonito [facility] [employer]  PharmD Candidate 2024    Maryan Puls, PharmD PGY-1 [facility] Resident

## 2022-10-27 ENCOUNTER — Ambulatory Visit: Payer: 59 | Admitting: Family Medicine

## 2022-11-13 ENCOUNTER — Ambulatory Visit: Payer: 59 | Admitting: Physician Assistant

## 2022-11-22 ENCOUNTER — Other Ambulatory Visit: Payer: Self-pay

## 2022-11-22 ENCOUNTER — Emergency Department (HOSPITAL_COMMUNITY)
Admission: EM | Admit: 2022-11-22 | Discharge: 2022-11-23 | Disposition: A | Discharge status: Home / Self Care | Payer: Commercial Managed Care - HMO | Attending: Emergency Medicine | Admitting: Emergency Medicine

## 2022-11-22 DIAGNOSIS — R400 Somnolence: Secondary | ICD-10-CM | POA: Insufficient documentation

## 2022-11-22 DIAGNOSIS — R531 Weakness: Secondary | ICD-10-CM | POA: Diagnosis present

## 2022-11-22 DIAGNOSIS — Y900 Blood alcohol level of less than 20 mg/100 ml: Secondary | ICD-10-CM | POA: Insufficient documentation

## 2022-11-22 LAB — COMPREHENSIVE METABOLIC PANEL
ALT: 8 U/L (ref 0–44)
AST: 15 U/L (ref 15–41)
Albumin: 2.9 g/dL — ABNORMAL LOW (ref 3.5–5.0)
Alkaline Phosphatase: 59 U/L (ref 38–126)
Anion gap: 12 (ref 5–15)
BUN: 11 mg/dL (ref 6–20)
CO2: 23 mmol/L (ref 22–32)
Calcium: 8.4 mg/dL — ABNORMAL LOW (ref 8.9–10.3)
Chloride: 103 mmol/L (ref 98–111)
Creatinine, Ser: 0.96 mg/dL (ref 0.61–1.24)
GFR, Estimated: >60 mL/min (ref >60)
Glucose, Bld: 106 mg/dL — ABNORMAL HIGH (ref 70–99)
Potassium: 4 mmol/L (ref 3.5–5.1)
Sodium: 138 mmol/L (ref 135–145)
Total Bilirubin: 0.6 mg/dL (ref 0.3–1.2)
Total Protein: 6.4 g/dL — ABNORMAL LOW (ref 6.5–8.1)

## 2022-11-22 LAB — CBC WITH DIFFERENTIAL/PLATELET
Abs Immature Granulocytes: 0.01 10*3/uL (ref 0.00–0.07)
Basophils Absolute: 0 10*3/uL (ref 0.0–0.1)
Basophils Relative: 1 %
Eosinophils Absolute: 0.1 10*3/uL (ref 0.0–0.5)
Eosinophils Relative: 3 %
HCT: 44 % (ref 39.0–52.0)
Hemoglobin: 14.7 g/dL (ref 13.0–17.0)
Immature Granulocytes: 0 %
Lymphocytes Relative: 33 %
Lymphs Abs: 1.1 10*3/uL (ref 0.7–4.0)
MCH: 31.1 pg (ref 26.0–34.0)
MCHC: 33.4 g/dL (ref 30.0–36.0)
MCV: 93.2 fL (ref 80.0–100.0)
Monocytes Absolute: 0.4 10*3/uL (ref 0.1–1.0)
Monocytes Relative: 12 %
Neutro Abs: 1.7 10*3/uL (ref 1.7–7.7)
Neutrophils Relative %: 51 %
Platelets: 179 10*3/uL (ref 150–400)
RBC: 4.72 MIL/uL (ref 4.22–5.81)
RDW: 13.5 % (ref 11.5–15.5)
WBC: 3.4 10*3/uL — ABNORMAL LOW (ref 4.0–10.5)
nRBC: 0 % (ref 0.0–0.2)

## 2022-11-22 LAB — ETHANOL: Alcohol, Ethyl (B): <10 mg/dL (ref <10)

## 2022-11-22 NOTE — ED Notes (Signed)
Wife [name] [phone] would like an update asap

## 2022-11-22 NOTE — Discharge Instructions (Addendum)
Please take medication as directed and follow-up with your primary care physician.  Do not hesitate to return here for concerning changes in your condition.

## 2022-11-22 NOTE — ED Triage Notes (Addendum)
Chief Complaint  Patient presents with   Weakness   Pt presents to ED 26 from home with above complaint.  Per report, pt was going to give plasma today and was denied due to his blood pressure being "too high."  Pt told EMS he tripled his Amlodipine dose.  EMS states pt did not want to come to the hospital but was convinced by a friend.  Pt did not want EMS touching him.  EMS noted that pt's pupils were 46mm and pinpoint but states pt denied taking any illicit drugs or extra Tramadol.  Pt dozing off during triage.

## 2022-11-22 NOTE — ED Provider Notes (Signed)
[facility] Provider Note   CSN: [id number] Arrival date & time: 11/22/22  1947     History  Chief Complaint  Patient presents with   Weakness    Dejesus Brena is a 57 y.o. male.  HPI Patient presents at the request of a friend, seemingly.  Patient is sleeping on my evaluation wake up long enough to state that he is not sure why he is here, but falls asleep again almost immediately. Per EMS the patient's friend wanted him to be evaluated due to concern for hypertension.  Patient cannot provide any these details level 5 caveat. EMS reports the patient's pupils were 1 mm, pinpoint on their evaluation.     Home Medications Prior to Admission medications   Medication Sig Start Date End Date Taking? Authorizing Provider  amLODipine (NORVASC) 10 MG tablet Take 1 tablet (10 mg total) by mouth daily. 08/20/22   Argentina Donovan, PA-C  atorvastatin (LIPITOR) 20 MG tablet Take 1 tablet (20 mg total) by mouth once daily. 02/19/22   Charlott Rakes, MD  cyclobenzaprine (FLEXERIL) 10 MG tablet Take 1 tablet (10 mg total) by mouth 2 (two) times daily as needed for muscle spasms. 10/01/22   Hans Eden, NP  diclofenac Sodium (VOLTAREN) 1 % GEL Apply 2 g topically 4 (four) times daily. 08/20/22   Argentina Donovan, PA-C  erythromycin ophthalmic ointment Apply to left eye 3 times daily. 05/28/22   Katy Apo, MD  FLUoxetine (PROZAC) 20 MG capsule Take 1 capsule (20 mg total) by mouth once daily. 02/19/22   Charlott Rakes, MD  meloxicam (MOBIC) 15 MG tablet 1 po q d 11/02/19   Meredith Pel, MD  predniSONE (STERAPRED UNI-PAK 21 TAB) 10 MG (21) TBPK tablet take as directed on package 10/01/22   Hans Eden, NP  tadalafil (CIALIS) 10 MG tablet Take 1 tablet (10 mg total) by mouth every other day as needed for erectile dysfunction. 08/12/19   Charlott Rakes, MD  traMADol (ULTRAM) 50 MG tablet Take 1 tablet (50 mg total) by mouth every 6  (six) hours as needed. 08/20/22   Argentina Donovan, PA-C      Allergies    Albuterol sulfate, Florastor [yeast], and Tylenol [acetaminophen]    Review of Systems   Review of Systems  Unable to perform ROS: Acuity of condition    Physical Exam Updated Vital Signs BP (!) 147/84 (BP Location: Right Arm)   Pulse (!) 57   Temp 97.7 F (36.5 C) (Oral)   Resp 15   Wt 101.5 kg   SpO2 100%   BMI 25.21 kg/m  Physical Exam Vitals and nursing note reviewed.  Constitutional:      General: He is not in acute distress.    Appearance: He is well-developed.     Comments: Somnolent adult male thin, in no distress, awakens to minimal stimuli, falls asleep again almost immediately.  He does state that he is not sure why he is here.  HENT:     Head: Normocephalic and atraumatic.  Eyes:     Conjunctiva/sclera: Conjunctivae normal.  Cardiovascular:     Rate and Rhythm: Normal rate and regular rhythm.  Pulmonary:     Effort: Pulmonary effort is normal. No respiratory distress.     Breath sounds: No stridor.  Abdominal:     General: There is no distension.  Skin:    General: Skin is warm and dry.  Neurological:  Comments: Moves all extremities spontaneously, minimally and appropriately to minimal stimuli.  Speech when offered is brief, clear.  Face is symmetric.  Psychiatric:     Comments: Hypersomnolent     ED Results / Procedures / Treatments   Labs (all labs ordered are listed, but only abnormal results are displayed) Labs Reviewed - No data to display  EKG None  Radiology No results found.  Procedures Procedures    Medications Ordered in ED Medications - No data to display  ED Course/ Medical Decision Making/ A&P                             Medical Decision Making Patient presents via EMS with concern for hypertension, and hypersomnolence.  No evidence for trauma, patient's initial vital signs are normal, some suspicion for intoxication versus other ingestion,  given the absence of other overt signs or symptoms.  Patient placed on continuous cardiac monitoring, pulse oximetry.  Labs sent. Pulse ox 99% room air normal Cardiac 60 sinus normal   Amount and/or Complexity of Data Reviewed Independent Historian: EMS External Data Reviewed: notes.    Details: 5 prior ED visits in the past 6 months Labs: ordered. Decision-making details documented in ED Course.  Risk Decision regarding hospitalization.   10:48 PM Patient remains hemodynamically unremarkable after hours of monitoring here.  He now awakens more easily, but again falls asleep again quickly. Patient has no evidence for trauma, no hemodynamic instability, no electrolyte abnormalities.  Patient states that he is okay with going home, but he will likely require more time to awaken before leaving.  However, no evidence for acute findings as above, no evidence for infection, patient likely appropriate for discharge when he awakens.  11:47 PM Patient awake, alert after obtaining consent I discussed his presentation with his wife via telephone while I was at bedside.  She described his presentation earlier today with hypersomnolence, some consideration of drug use.  Patient notes that he has not used crack cocaine recently.  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  Patient now is awake, asking for sandwich, has no complaints, is appropriate for discharge.        Final Clinical Impression(s) / ED Diagnoses Final diagnoses:  Weakness     Carmin Muskrat, MD 11/22/22 2347

## 2022-11-23 NOTE — ED Notes (Signed)
Pt provided with sandwich per provider request.  Pt eating at this time.

## 2022-11-26 ENCOUNTER — Ambulatory Visit: Payer: Commercial Managed Care - HMO | Admitting: Family Medicine

## 2022-12-29 ENCOUNTER — Other Ambulatory Visit (HOSPITAL_COMMUNITY): Payer: Self-pay

## 2022-12-29 ENCOUNTER — Other Ambulatory Visit: Payer: Self-pay | Admitting: Pharmacist

## 2022-12-29 NOTE — Progress Notes (Signed)
Patient outreached by Karlton Lemon, PharmD Candidate on 12/29/2022 to discuss hypertension.    Patient has an automated home blood pressure machine. They take their BP about twice a week. They report home readings ~130s/90s    Medication review was performed. They are not taking medications as prescribed. Patient reports taking their amlodipine only about 3 times per week. This is partly due to transportation concerns.   The following barriers to adherence were noted:  - They do not have cost concerns.  - They do have transportation concerns. Patient reports difficulty getting transportation to their pharmacy to pick up their meds, which has led to lack of adherence.  - They do need assistance obtaining refills. Patient is interested in a service where meds are delivered to their door.  - They do occasionally forget to take some of their prescribed medications. Amlodipine 3x per week instead of daily.  - They do not feel like one/some of their medications make them feel poorly.  - They do not have questions or concerns about their medications.  - They do have follow up scheduled with their primary care provider/cardiologist.   The following interventions were completed:  - Medications were reviewed  - Patient was educated on goal blood pressures and long term health implications of elevated blood pressure  - Patient was educated on proper technique to check home blood pressure and reminded to bring home machine and readings to next provider appointment. Patient educated to take their BP more often (once per day) and after they have taken their BP meds.  - Patient was counseled on lifestyle modifications to improve blood pressure, including diet and exercise. Patient reports walking 1-2 miles per day.   Assisted in setting patient up for mail order pharmacy due to transportation barriers.   The patient has follow up scheduled:  02/10/2023 with Dr. Teodora Medici, PharmD  Candidate   Catie Eppie Gibson, PharmD, BCACP, CPP [facility] [phone]

## 2023-01-15 IMAGING — CR DG FEMUR 2+V*L*
4 series · 4 of 4 positions shown · non-contrast
Comparison: None.

CLINICAL DATA: Fall last week.  Left femur pain.

EXAM:
LEFT FEMUR 2 VIEWS

[femur ap (1 of 2)]
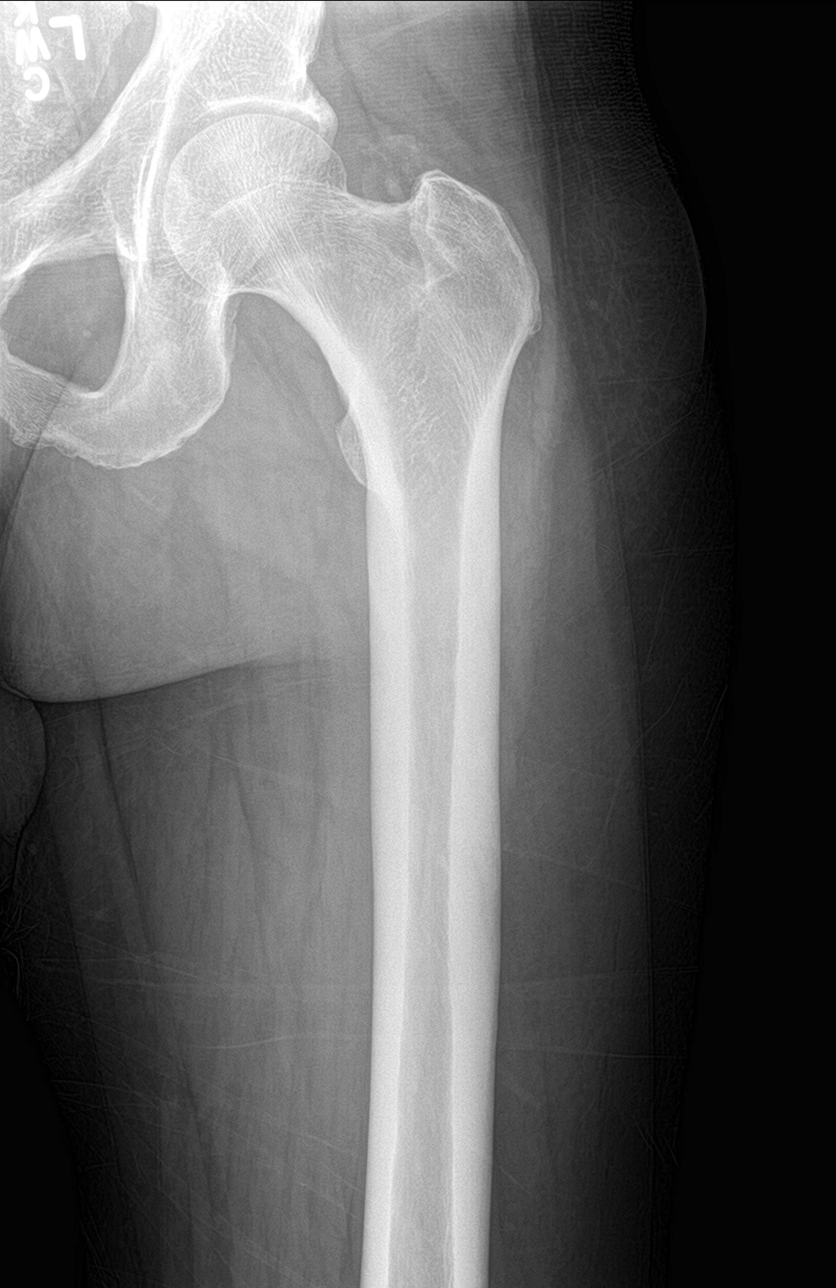

[femur ap (2 of 2)]
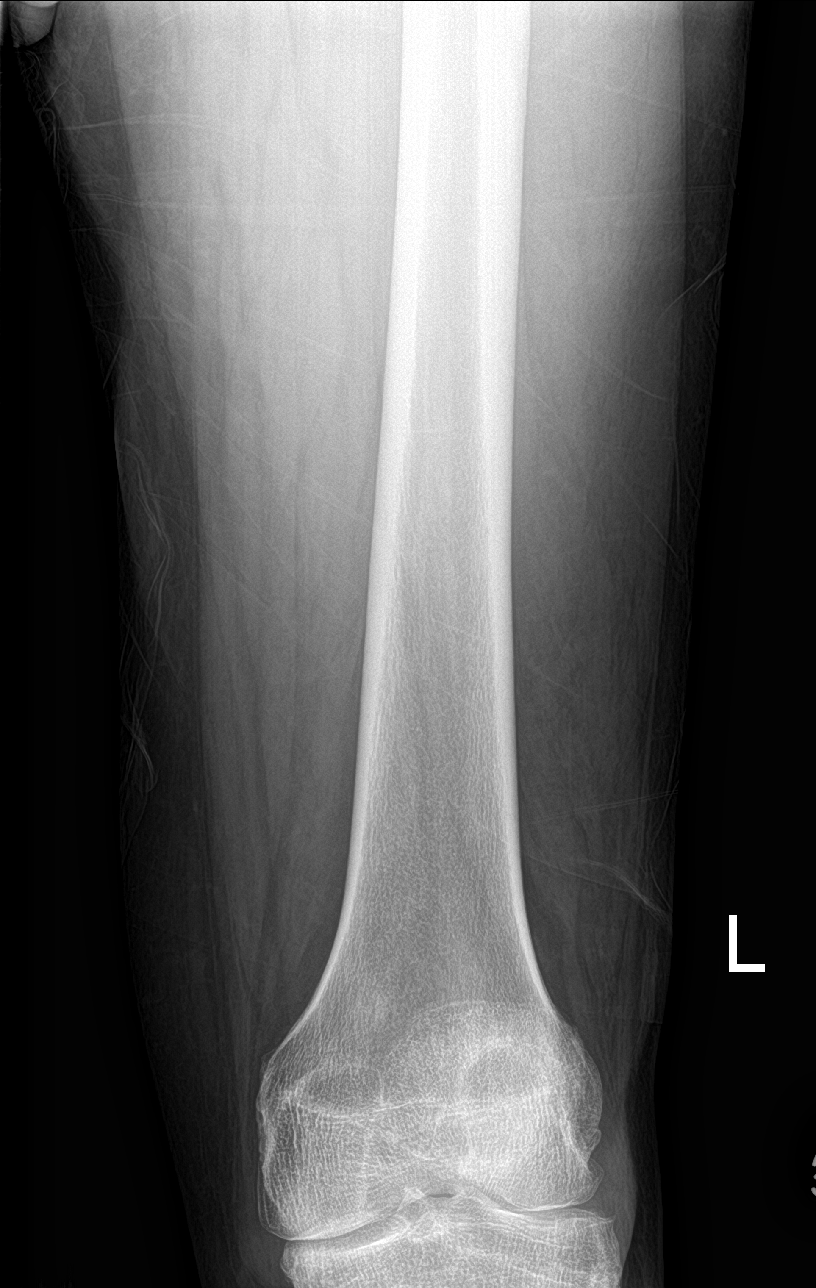

[femur lat (1 of 2)]
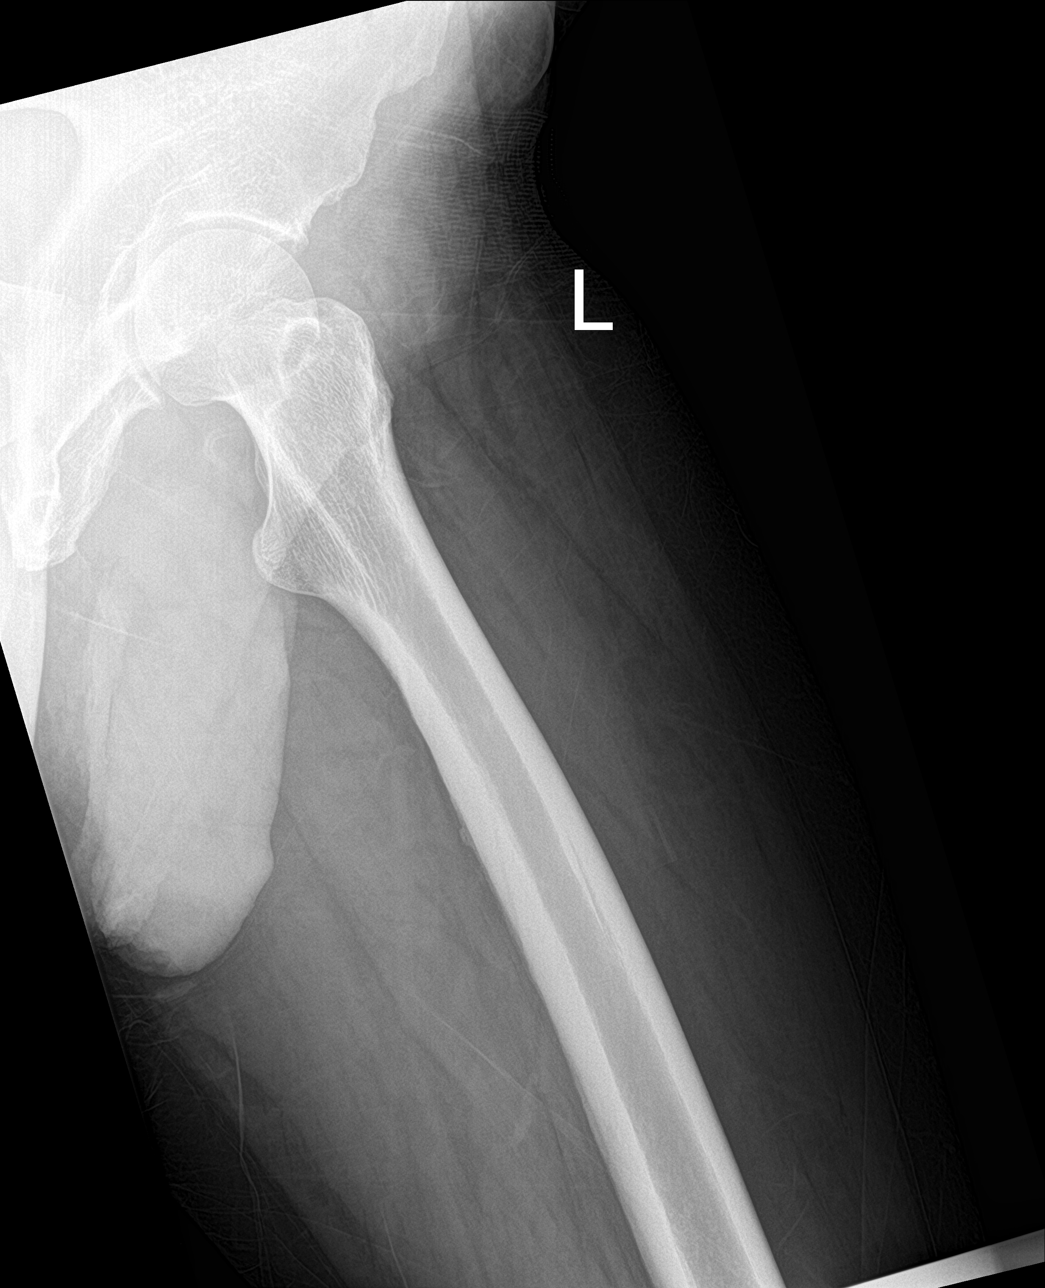

[femur lat (2 of 2)]
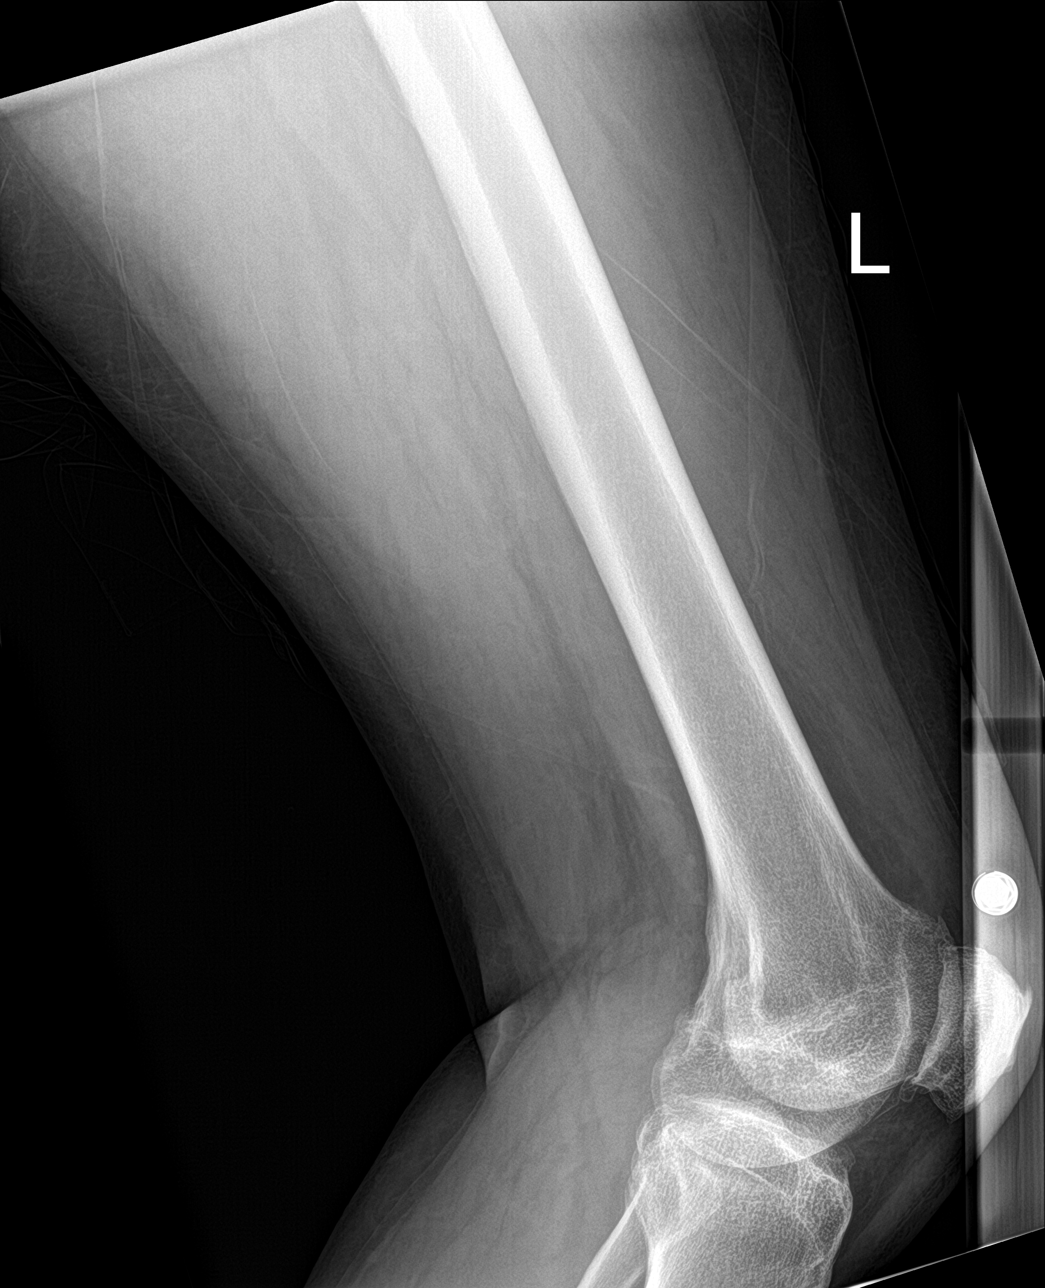

[4 of 4 positions shown; findings below may reference images not displayed]

FINDINGS: There is no evidence of fracture or other focal bone lesions. Soft
tissues are unremarkable. Mild degenerative changes are seen
involving the left knee.
IMPRESSION: No acute findings.

Mild left knee osteoarthritis.

## 2023-02-09 ENCOUNTER — Telehealth: Payer: Self-pay

## 2023-02-09 NOTE — Telephone Encounter (Signed)
Patient attempted to be outreached by Sharman Cheek, PharmD Candidate on 02/09/23 to discuss hypertension. Left voicemail for patient to return our call at their convenience at [phone].  [facility], [place]

## 2023-02-10 ENCOUNTER — Ambulatory Visit: Payer: Commercial Managed Care - HMO | Admitting: Family Medicine

## 2023-09-09 ENCOUNTER — Encounter (HOSPITAL_COMMUNITY): Payer: Self-pay | Admitting: Emergency Medicine

## 2023-09-09 ENCOUNTER — Other Ambulatory Visit: Payer: Self-pay

## 2023-09-09 ENCOUNTER — Emergency Department (HOSPITAL_COMMUNITY)
Admission: EM | Admit: 2023-09-09 | Discharge: 2023-09-09 | Disposition: A | Discharge status: Home / Self Care | Payer: Medicaid Other | Attending: Emergency Medicine | Admitting: Emergency Medicine

## 2023-09-09 DIAGNOSIS — R112 Nausea with vomiting, unspecified: Secondary | ICD-10-CM | POA: Insufficient documentation

## 2023-09-09 LAB — CBC
HCT: 40.7 % (ref 39.0–52.0)
Hemoglobin: 13.7 g/dL (ref 13.0–17.0)
MCH: 30.7 pg (ref 26.0–34.0)
MCHC: 33.7 g/dL (ref 30.0–36.0)
MCV: 91.3 fL (ref 80.0–100.0)
Platelets: UNDETERMINED 10*3/uL (ref 150–400)
RBC: 4.46 MIL/uL (ref 4.22–5.81)
RDW: 14.5 % (ref 11.5–15.5)
WBC: 3.5 10*3/uL — ABNORMAL LOW (ref 4.0–10.5)
nRBC: 0 % (ref 0.0–0.2)

## 2023-09-09 LAB — COMPREHENSIVE METABOLIC PANEL
ALT: 12 U/L (ref 0–44)
AST: 26 U/L (ref 15–41)
Albumin: 3.8 g/dL (ref 3.5–5.0)
Alkaline Phosphatase: 76 U/L (ref 38–126)
Anion gap: 12 (ref 5–15)
BUN: 11 mg/dL (ref 6–20)
CO2: 22 mmol/L (ref 22–32)
Calcium: 8.7 mg/dL — ABNORMAL LOW (ref 8.9–10.3)
Chloride: 100 mmol/L (ref 98–111)
Creatinine, Ser: 1 mg/dL (ref 0.61–1.24)
GFR, Estimated: >60 mL/min (ref >60)
Glucose, Bld: 114 mg/dL — ABNORMAL HIGH (ref 70–99)
Potassium: 3.8 mmol/L (ref 3.5–5.1)
Sodium: 134 mmol/L — ABNORMAL LOW (ref 135–145)
Total Bilirubin: 0.9 mg/dL (ref 0.0–1.2)
Total Protein: 7.7 g/dL (ref 6.5–8.1)

## 2023-09-09 LAB — URINALYSIS, ROUTINE W REFLEX MICROSCOPIC
Bacteria, UA: NONE SEEN
Bilirubin Urine: NEGATIVE
Glucose, UA: NEGATIVE mg/dL
Ketones, ur: 20 mg/dL — AB
Leukocytes,Ua: NEGATIVE
Nitrite: NEGATIVE
Protein, ur: NEGATIVE mg/dL
Specific Gravity, Urine: 1.009 (ref 1.005–1.030)
pH: 5 (ref 5.0–8.0)

## 2023-09-09 LAB — LIPASE, BLOOD: Lipase: 25 U/L (ref 11–51)

## 2023-09-09 MED ORDER — ONDANSETRON 4 MG PO TBDP
4.0000 mg | ORAL_TABLET | Freq: Once | ORAL | Status: AC
  Administered 2023-09-09: 4 mg via ORAL
  Filled 2023-09-09: qty 1

## 2023-09-09 MED ORDER — ONDANSETRON 4 MG PO TBDP
4.0000 mg | ORAL_TABLET | ORAL | 0 refills | Status: DC | PRN
  Filled 2023-09-09: qty 20, fill #0
  Filled 2023-09-10: qty 20, 4d supply, fill #0

## 2023-09-09 NOTE — ED Triage Notes (Signed)
 Pt arrives via EMS from work with nausea vomiting for the last 2 hours. Pt admits to ETOH use last night. Pt awake, alert, appropriate. VSS. CBG 128. 160/100. HR NSR 80, 98% RA.

## 2023-09-09 NOTE — Discharge Instructions (Signed)
 Return for worsening pain, fever, inability to eat or drink.   Try pepcid  or tagamet up to twice a day.  Try to avoid things that may make this worse, most commonly these are spicy foods tomato based products fatty foods chocolate and peppermint.  Alcohol and tobacco can also make this worse.  Return to the emergency department for sudden worsening pain fever or inability to eat or drink.

## 2023-09-10 ENCOUNTER — Other Ambulatory Visit (HOSPITAL_COMMUNITY): Payer: Self-pay

## 2023-09-10 ENCOUNTER — Other Ambulatory Visit (HOSPITAL_BASED_OUTPATIENT_CLINIC_OR_DEPARTMENT_OTHER): Payer: Self-pay

## 2023-09-10 ENCOUNTER — Other Ambulatory Visit: Payer: Self-pay

## 2023-09-15 NOTE — ED Provider Notes (Signed)
 [place] EMERGENCY DEPARTMENT AT [facility] Provider Note   CSN: [id number] Arrival date & time: 09/09/23  1027     History  Chief Complaint  Patient presents with   Emesis    Phillip Oliver is a 58 y.o. male.  58 yo M with a cc of n/v.  Going on for a couple hours.  He reportedly had been drinking prior to this.  Patient admitted waiting a couple hours prior to my evaluation.  He is feeling better and tolerating by mouth.  He would like to go home.  Denies abdominal pain denies fevers.   Emesis      Home Medications Prior to Admission medications   Medication Sig Start Date End Date Taking? Authorizing Provider  ondansetron  (ZOFRAN -ODT) 4 MG disintegrating tablet Take 1 tablet (4 mg total) by mouth every 4 (four) hours as needed. 09/09/23  Yes Emil Share, DO  amLODipine  (NORVASC ) 10 MG tablet Take 1 tablet (10 mg total) by mouth daily. 08/20/22   Danton Jon HERO, PA-C  atorvastatin  (LIPITOR) 20 MG tablet Take 1 tablet (20 mg total) by mouth once daily. 02/19/22   Newlin, Enobong, MD  cyclobenzaprine  (FLEXERIL ) 10 MG tablet Take 1 tablet (10 mg total) by mouth 2 (two) times daily as needed for muscle spasms. 10/01/22   Teresa Shelba SAUNDERS, NP  diclofenac  Sodium (VOLTAREN ) 1 % GEL Apply 2 g topically 4 (four) times daily. 08/20/22   McClung, Angela M, PA-C  erythromycin  ophthalmic ointment Apply to left eye 3 times daily. 05/28/22   Charmayne Molly, MD  FLUoxetine  (PROZAC ) 20 MG capsule Take 1 capsule (20 mg total) by mouth once daily. 02/19/22   Newlin, Enobong, MD  meloxicam  (MOBIC ) 15 MG tablet 1 po q d 11/02/19   Addie Cordella Hamilton, MD  predniSONE  (STERAPRED UNI-PAK 21 TAB) 10 MG (21) TBPK tablet take as directed on package 10/01/22   Teresa Shelba SAUNDERS, NP  tadalafil  (CIALIS ) 10 MG tablet Take 1 tablet (10 mg total) by mouth every other day as needed for erectile dysfunction. 08/12/19   Newlin, Enobong, MD  traMADol  (ULTRAM ) 50 MG tablet Take 1 tablet (50 mg total) by mouth  every 6 (six) hours as needed. 08/20/22   Danton Jon HERO, PA-C      Allergies    Albuterol  sulfate, Florastor [yeast], and Tylenol  [acetaminophen ]    Review of Systems   Review of Systems  Gastrointestinal:  Positive for vomiting.    Physical Exam Updated Vital Signs BP (!) 159/107   Pulse 69   Temp 97.8 F (36.6 C)   Resp 16   Ht 6' 7 (2.007 m)   Wt 106.6 kg   SpO2 100%   BMI 26.47 kg/m  Physical Exam Vitals and nursing note reviewed.  Constitutional:      Appearance: He is well-developed.  HENT:     Head: Normocephalic and atraumatic.  Eyes:     Pupils: Pupils are equal, round, and reactive to light.  Neck:     Vascular: No JVD.  Cardiovascular:     Rate and Rhythm: Normal rate and regular rhythm.     Heart sounds: No murmur heard.    No friction rub. No gallop.  Pulmonary:     Effort: No respiratory distress.     Breath sounds: No wheezing.  Abdominal:     General: There is no distension.     Tenderness: There is no abdominal tenderness. There is no guarding or rebound.  Musculoskeletal:  General: Normal range of motion.     Cervical back: Normal range of motion and neck supple.  Skin:    Coloration: Skin is not pale.     Findings: No rash.  Neurological:     Mental Status: He is alert and oriented to person, place, and time.  Psychiatric:        Behavior: Behavior normal.     ED Results / Procedures / Treatments   Labs (all labs ordered are listed, but only abnormal results are displayed) Labs Reviewed  COMPREHENSIVE METABOLIC PANEL - Abnormal; Notable for the following components:      Result Value   Sodium 134 (*)    Glucose, Bld 114 (*)    Calcium  8.7 (*)    All other components within normal limits  CBC - Abnormal; Notable for the following components:   WBC 3.5 (*)    All other components within normal limits  URINALYSIS, ROUTINE W REFLEX MICROSCOPIC - Abnormal; Notable for the following components:   Hgb urine dipstick SMALL  (*)    Ketones, ur 20 (*)    All other components within normal limits  LIPASE, BLOOD    EKG None  Radiology No results found.  Procedures Procedures    Medications Ordered in ED Medications  ondansetron  (ZOFRAN -ODT) disintegrating tablet 4 mg (4 mg Oral Given 09/09/23 1240)    ED Course/ Medical Decision Making/ A&P                                 Medical Decision Making Amount and/or Complexity of Data Reviewed Labs: ordered.  Risk Prescription drug management.   58 yo M with a chief complaint of nausea and vomiting after drinking alcohol last night.  He said he is no longer vomiting and would like a sandwich.  He has benign abdominal exam.  Lab work without LFT elevation lipase unremarkable.  Will discharge home.  PCP follow-up.  7:02 AM:  I have discussed the diagnosis/risks/treatment options with the patient.  Evaluation and diagnostic testing in the emergency department does not suggest an emergent condition requiring admission or immediate intervention beyond what has been performed at this time.  They will follow up with PCP. We also discussed returning to the ED immediately if new or worsening sx occur. We discussed the sx which are most concerning (e.g., sudden worsening pain, fever, inability to tolerate by mouth) that necessitate immediate return. Medications administered to the patient during their visit and any new prescriptions provided to the patient are listed below.  Medications given during this visit Medications  ondansetron  (ZOFRAN -ODT) disintegrating tablet 4 mg (4 mg Oral Given 09/09/23 1240)     The patient appears reasonably screen and/or stabilized for discharge and I doubt any other medical condition or other [facility] requiring further screening, evaluation, or treatment in the ED at this time prior to discharge.          Final Clinical Impression(s) / ED Diagnoses Final diagnoses:  Nausea and vomiting, unspecified vomiting type    Rx / DC  Orders ED Discharge Orders          Ordered    ondansetron  (ZOFRAN -ODT) 4 MG disintegrating tablet  Every 4 hours PRN        09/09/23 1236              Emil Share, DO 09/15/23 [phone]

## 2024-03-26 ENCOUNTER — Telehealth: Payer: Self-pay

## 2024-03-26 ENCOUNTER — Emergency Department (HOSPITAL_COMMUNITY)
Admission: EM | Admit: 2024-03-26 | Discharge: 2024-03-26 | Disposition: A | Discharge status: Home / Self Care | Payer: Self-pay | Attending: Emergency Medicine | Admitting: Emergency Medicine

## 2024-03-26 ENCOUNTER — Other Ambulatory Visit (HOSPITAL_COMMUNITY): Payer: Self-pay

## 2024-03-26 ENCOUNTER — Encounter (HOSPITAL_COMMUNITY): Payer: Self-pay

## 2024-03-26 ENCOUNTER — Other Ambulatory Visit: Payer: Self-pay

## 2024-03-26 DIAGNOSIS — K2920 Alcoholic gastritis without bleeding: Secondary | ICD-10-CM | POA: Insufficient documentation

## 2024-03-26 LAB — COMPREHENSIVE METABOLIC PANEL WITH GFR
ALT: 10 U/L (ref 0–44)
AST: 18 U/L (ref 15–41)
Albumin: 4 g/dL (ref 3.5–5.0)
Alkaline Phosphatase: 60 U/L (ref 38–126)
Anion gap: 10 (ref 5–15)
BUN: 13 mg/dL (ref 6–20)
CO2: 24 mmol/L (ref 22–32)
Calcium: 9.2 mg/dL (ref 8.9–10.3)
Chloride: 102 mmol/L (ref 98–111)
Creatinine, Ser: 1.15 mg/dL (ref 0.61–1.24)
GFR, Estimated: >60 mL/min (ref >60)
Glucose, Bld: 76 mg/dL (ref 70–99)
Potassium: 4.1 mmol/L (ref 3.5–5.1)
Sodium: 136 mmol/L (ref 135–145)
Total Bilirubin: 0.9 mg/dL (ref 0.0–1.2)
Total Protein: 8.4 g/dL — ABNORMAL HIGH (ref 6.5–8.1)

## 2024-03-26 LAB — CBC
HCT: 39.8 % (ref 39.0–52.0)
Hemoglobin: 13.4 g/dL (ref 13.0–17.0)
MCH: 30.5 pg (ref 26.0–34.0)
MCHC: 33.7 g/dL (ref 30.0–36.0)
MCV: 90.7 fL (ref 80.0–100.0)
Platelets: 221 K/uL (ref 150–400)
RBC: 4.39 MIL/uL (ref 4.22–5.81)
RDW: 13.8 % (ref 11.5–15.5)
WBC: 4.5 K/uL (ref 4.0–10.5)
nRBC: 0 % (ref 0.0–0.2)

## 2024-03-26 LAB — CBG MONITORING, ED: Glucose-Capillary: 74 mg/dL (ref 70–99)

## 2024-03-26 MED ORDER — ONDANSETRON HCL 4 MG/2ML IJ SOLN
4.0000 mg | Freq: Once | INTRAMUSCULAR | Status: AC
  Administered 2024-03-26: 4 mg via INTRAVENOUS
  Filled 2024-03-26: qty 2

## 2024-03-26 MED ORDER — LACTATED RINGERS IV BOLUS
500.0000 mL | Freq: Once | INTRAVENOUS | Status: AC
  Administered 2024-03-26: 500 mL via INTRAVENOUS

## 2024-03-26 MED ORDER — PANTOPRAZOLE SODIUM 40 MG IV SOLR
40.0000 mg | Freq: Once | INTRAVENOUS | Status: AC
  Administered 2024-03-26: 40 mg via INTRAVENOUS
  Filled 2024-03-26: qty 10

## 2024-03-26 MED ORDER — ONDANSETRON 8 MG PO TBDP
8.0000 mg | ORAL_TABLET | Freq: Three times a day (TID) | ORAL | 0 refills | Status: AC | PRN
  Filled 2024-03-26: qty 20, 7d supply, fill #0

## 2024-03-26 MED ORDER — PANTOPRAZOLE SODIUM 20 MG PO TBEC
20.0000 mg | DELAYED_RELEASE_TABLET | Freq: Every day | ORAL | 0 refills | Status: AC
  Filled 2024-03-26: qty 30, 30d supply, fill #0

## 2024-03-26 NOTE — Care Management (Addendum)
 Patient has some prescriptions, he cannot pay for them, he does not get paid until next week. MATCH given, [facility] pharmacy is closed, asked provider to change to [facility] on [facility] MEDICATION ASSISTANCE CARD Pharmacies please call: [phone] for claim processing assistance.  Rx BIN: [id number] Rx Group: [id number] Rx PCN: [id number] Relationship Code: 2 Person Code: [id number]  Patient ID (MRN): Phillip Oliver   [id number]        Patient Name:  Phillip Oliver    Patient DOB: 06/08/1966     Discharge Date: 03/26/2024    Expiration Date:04/03/2024 (must be filled within 7 days of discharge)   Dear  Phillip Oliver have been approved to have the prescriptions written by your discharging physician filled through our [facility] (Medication Assistance Through [facility]) program. This program allows for a one-time (no refills) 34-day supply of selected medications for a low copay amount.  The copay is $3.00 per prescription. For instance, if you have one prescription, you will pay $3.00; for two prescriptions, you pay $6.00; for three prescriptions, you pay $9.00; and so on. Only certain pharmacies are participating in this program with [facility]. You will need to select one of the pharmacies from the attached lists and take your prescriptions, this letter, and your photo ID to one of the participating pharmacies.  We are excited that you are able to use the [facility] program to get your medications. These prescriptions must be filled within 7 days of hospital discharge or they will no longer be valid for the [facility] program. Should you have any problems with your prescriptions please contact your case management team member at [phone] for [place] or [phone] for [facility].  Thank you, [facility]  [facility] Pharmacies [place]. [facility]  [facility] Pharmacies [facility] (continued) [facility] (continue [id number] [place]  [address] [place], [place] [address], [employer] 3518 [place], [place] [address] [place] [address] [place], [place] Transitions of Care - [facility] [address], [place] CVS [address], [place] [address], [facility] [address] [place] [address] [place] [address] [place], [place] [address] [place] [address] [place], [address] [place], [place] [address] [place] [address] [place], [place] [address] [place] [address] [place] [address] [place], [place] [address] [place], [place] 10[address] [place] [place], [place] [address], [place] [address], [place] [address] [place], [place] [address], [facility] [address] [place] [address] [place], [place] [address]00 [place], [place] [address] [place] [address] [place], [place] [address] [place], [place] [address], [place] [address], [place] [address], [place] [address], [place], [place] [address] [place] [address] [place], [place] [address]10 [place], [place] [address] [place] [address], [place] [address] [place], [address] [facility] [address] [place] [address] [place], [place] [address] [place] [address]11 [place],  [address] [place], [place] 2403 [place], [place] [address], [place] [address], [place] 3529 [place], [place] [address]703 [place], [place] [address] [place] [address] [facility] [address] [place], [place] [address]  [place], [place] [address] [place], [place] 2019 [place], [place] 2758 [place], [place] [address], [place] 904 [place], [place] [address], [place] [address] [place] [address], [place] [address] [place] [address], [place] [address], [place] [address] [place], [place] [address] [place], [place] [employer] [address], [place] [address] [employer], [place] [address] [place], [place] [address] [place] [address] [place], [place] [address] [place], [place] 3605 [place], [place] [address] [place], [place] [address] [place], [place] [address] [place], [place] [address] [place], [place] [address], [place] [address], [place] [address] [address], [place] Other [facility] [address] [place], [place] [facility] [address] [facility] [id number], [place]

## 2024-03-26 NOTE — ED Triage Notes (Signed)
 [place] [facility] [place] [facility] [place] for dizziness that began this morning around 11am. Per [facility] pt reports drinking 4 beers this morning before working in the heat. Pt denies LOC, CP, SHOB. Reports feeling weak and nauseous in triage. VSS per EMS:  148/84 HR 98 RR 18 98% RA CBG 94

## 2024-03-26 NOTE — ED Provider Notes (Signed)
 [place] EMERGENCY DEPARTMENT AT [facility] Provider Note   CSN: [id number] Arrival date & time: 03/26/24  1352     Patient presents with: Dizziness   Phillip Oliver is a 58 y.o. male.   HPI 58 year old male presents today stating that he feels generally nauseated and weak after drinking a large quantity of alcohol last night.  He reports a pint and a half of liquor and at least 5 beers.  He states he then drank some beer again this morning.  He feels generally weak and nauseated.  He has not been actively vomiting.  He denies any hematemesis or bright red blood per rectum.  He denies any headache, head injury, chest pain, or dyspnea.  Denies any other substance use or abuse.    Prior to Admission medications   Medication Sig Start Date End Date Taking? Authorizing Provider  ondansetron  (ZOFRAN -ODT) 8 MG disintegrating tablet Take 1 tablet (8 mg total) by mouth every 8 (eight) hours as needed for nausea or vomiting. 03/26/24  Yes Levander Houston, MD  pantoprazole  (PROTONIX ) 20 MG tablet Take 1 tablet (20 mg total) by mouth daily. 03/26/24  Yes Levander Houston, MD  amLODipine  (NORVASC ) 10 MG tablet Take 1 tablet (10 mg total) by mouth daily. 08/20/22   Danton Jon HERO, PA-C  atorvastatin  (LIPITOR) 20 MG tablet Take 1 tablet (20 mg total) by mouth once daily. 02/19/22   Newlin, Enobong, MD  cyclobenzaprine  (FLEXERIL ) 10 MG tablet Take 1 tablet (10 mg total) by mouth 2 (two) times daily as needed for muscle spasms. 10/01/22   Teresa Shelba SAUNDERS, NP  diclofenac  Sodium (VOLTAREN ) 1 % GEL Apply 2 g topically 4 (four) times daily. 08/20/22   McClung, Angela M, PA-C  erythromycin  ophthalmic ointment Apply to left eye 3 times daily. 05/28/22   Charmayne Molly, MD  FLUoxetine  (PROZAC ) 20 MG capsule Take 1 capsule (20 mg total) by mouth once daily. 02/19/22   Newlin, Enobong, MD  meloxicam  (MOBIC ) 15 MG tablet 1 po q d 11/02/19   Addie Cordella Hamilton, MD  predniSONE  (STERAPRED UNI-PAK 21 TAB) 10 MG  (21) TBPK tablet take as directed on package 10/01/22   Teresa Shelba SAUNDERS, NP  tadalafil  (CIALIS ) 10 MG tablet Take 1 tablet (10 mg total) by mouth every other day as needed for erectile dysfunction. 08/12/19   Newlin, Enobong, MD  traMADol  (ULTRAM ) 50 MG tablet Take 1 tablet (50 mg total) by mouth every 6 (six) hours as needed. 08/20/22   Danton Jon HERO, PA-C    Allergies: Albuterol  sulfate, Florastor [yeast], and Tylenol  [acetaminophen ]    Review of Systems  Updated Vital Signs BP (!) 168/99   Pulse 72   Temp 98.7 F (37.1 C) (Oral)   Resp 20   Ht 2.007 m (6' 7)   Wt 106.6 kg   SpO2 96%   BMI 26.47 kg/m   Physical Exam Vitals reviewed.  HENT:     Head: Normocephalic.     Right Ear: External ear normal.     Left Ear: External ear normal.     Nose: Nose normal.     Mouth/Throat:     Pharynx: Oropharynx is clear.  Eyes:     Pupils: Pupils are equal, round, and reactive to light.  Cardiovascular:     Rate and Rhythm: Normal rate and regular rhythm.     Pulses: Normal pulses.  Pulmonary:     Effort: Pulmonary effort is normal.     Breath sounds: Normal  breath sounds.  Abdominal:     General: Abdomen is flat. Bowel sounds are normal.     Palpations: Abdomen is soft.  Musculoskeletal:        General: Normal range of motion.     Cervical back: Normal range of motion.  Skin:    General: Skin is warm.     Capillary Refill: Capillary refill takes less than 2 seconds.  Neurological:     General: No focal deficit present.     Mental Status: He is alert.  Psychiatric:        Mood and Affect: Mood normal.     (all labs ordered are listed, but only abnormal results are displayed) Labs Reviewed  COMPREHENSIVE METABOLIC PANEL WITH GFR - Abnormal; Notable for the following components:      Result Value   Total Protein 8.4 (*)    All other components within normal limits  CBC  URINALYSIS, ROUTINE W REFLEX MICROSCOPIC  CBG MONITORING, ED     EKG: None  Radiology: No results found.   Procedures   Medications Ordered in the ED  lactated ringers  bolus 500 mL (0 mLs Intravenous Stopped 03/26/24 1626)  ondansetron  (ZOFRAN ) injection 4 mg (4 mg Intravenous Given 03/26/24 1543)  pantoprazole  (PROTONIX ) injection 40 mg (40 mg Intravenous Given 03/26/24 1545)                                    Medical Decision Making Amount and/or Complexity of Data Reviewed Labs: ordered.  Risk Prescription drug management.  58 year old male who drank a large quantity of alcohol last night and has nausea, generalized weakness, today.  He is evaluated here with labs that showed no evidence of acute abnormality.  He was treated with ondansetron , pantoprazole , and a fluid bolus.  He is taking p.o. without difficulty here.  He appears stable for discharge.  He is advised regarding not drinking alcohol and return precautions.      Final diagnoses:  Acute alcoholic gastritis without hemorrhage    ED Discharge Orders          Ordered    ondansetron  (ZOFRAN -ODT) 8 MG disintegrating tablet  Every 8 hours PRN        03/26/24 1631    pantoprazole  (PROTONIX ) 20 MG tablet  Daily        03/26/24 1631               Levander Houston, MD 03/26/24 [phone]

## 2024-03-26 NOTE — Discharge Instructions (Signed)
 Do not drink any alcohol.  Drink plenty of clear liquids.  Use medicines as needed for pain and nausea.  Return if you are having worsening symptoms

## 2024-07-12 ENCOUNTER — Other Ambulatory Visit: Payer: Self-pay

## 2024-07-12 ENCOUNTER — Emergency Department (HOSPITAL_COMMUNITY)
Admission: EM | Admit: 2024-07-12 | Discharge: 2024-07-12 | Discharge status: Against Medical Advice | Payer: Self-pay | Attending: Emergency Medicine | Admitting: Emergency Medicine

## 2024-07-12 DIAGNOSIS — R1110 Vomiting, unspecified: Secondary | ICD-10-CM | POA: Insufficient documentation

## 2024-07-12 DIAGNOSIS — Z5321 Procedure and treatment not carried out due to patient leaving prior to being seen by health care provider: Secondary | ICD-10-CM | POA: Insufficient documentation

## 2024-07-12 DIAGNOSIS — R059 Cough, unspecified: Secondary | ICD-10-CM | POA: Insufficient documentation

## 2024-07-12 DIAGNOSIS — R519 Headache, unspecified: Secondary | ICD-10-CM | POA: Insufficient documentation

## 2024-07-12 DIAGNOSIS — R0981 Nasal congestion: Secondary | ICD-10-CM | POA: Insufficient documentation

## 2024-07-12 DIAGNOSIS — R0602 Shortness of breath: Secondary | ICD-10-CM | POA: Insufficient documentation

## 2024-07-12 LAB — RESP PANEL BY RT-PCR (RSV, FLU A&B, COVID)  RVPGX2
Influenza A by PCR: NEGATIVE
Influenza B by PCR: NEGATIVE
Resp Syncytial Virus by PCR: NEGATIVE
SARS Coronavirus 2 by RT PCR: NEGATIVE

## 2024-07-12 MED ORDER — IBUPROFEN 400 MG PO TABS
400.0000 mg | ORAL_TABLET | Freq: Once | ORAL | Status: AC | PRN
  Administered 2024-07-12: 400 mg via ORAL
  Filled 2024-07-12: qty 1

## 2024-07-12 NOTE — ED Triage Notes (Signed)
 Pt here for nasal congestion, SOB on exertion, productive cough and vomiting since yesterday. Pt also endorses headache.

## 2024-07-12 NOTE — ED Notes (Signed)
 Pt requesting a bus pass so he can leave without being seen. This RN explained to him that we cannot provide a ride home if he was not formally discharged. Pt walked out of ED doors mad
